# Patient Record
Sex: Female | Born: 1970 | Race: White | Hispanic: No | Marital: Married | Smoking: Never smoker
Health system: Southern US, Community
[De-identification: ages and names within clinical notes are randomized; demographics above are authoritative.]

## PROBLEM LIST (undated history)

## (undated) DIAGNOSIS — R3915 Urgency of urination: Secondary | ICD-10-CM

## (undated) DIAGNOSIS — Z87442 Personal history of urinary calculi: Secondary | ICD-10-CM

## (undated) DIAGNOSIS — N200 Calculus of kidney: Secondary | ICD-10-CM

## (undated) DIAGNOSIS — F909 Attention-deficit hyperactivity disorder, unspecified type: Secondary | ICD-10-CM

## (undated) DIAGNOSIS — N201 Calculus of ureter: Secondary | ICD-10-CM

## (undated) HISTORY — PX: APPENDECTOMY: SHX54

---
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
## 2000-02-04 ENCOUNTER — Emergency Department (HOSPITAL_COMMUNITY): Admission: EM | Admit: 2000-02-04 | Discharge: 2000-02-04 | Payer: Self-pay | Admitting: Emergency Medicine

## 2011-03-07 ENCOUNTER — Other Ambulatory Visit: Payer: Self-pay | Admitting: Obstetrics and Gynecology

## 2011-03-07 ENCOUNTER — Other Ambulatory Visit (HOSPITAL_COMMUNITY)
Admission: RE | Admit: 2011-03-07 | Discharge: 2011-03-07 | Disposition: A | Discharge status: Home / Self Care | Payer: Private Health Insurance - Indemnity | Source: Ambulatory Visit | Attending: Obstetrics and Gynecology | Admitting: Obstetrics and Gynecology

## 2011-03-07 ENCOUNTER — Ambulatory Visit (HOSPITAL_BASED_OUTPATIENT_CLINIC_OR_DEPARTMENT_OTHER)
Admission: RE | Admit: 2011-03-07 | Discharge: 2011-03-07 | Disposition: A | Discharge status: Home / Self Care | Payer: Private Health Insurance - Indemnity | Source: Ambulatory Visit | Attending: Obstetrics and Gynecology | Admitting: Obstetrics and Gynecology

## 2011-03-07 DIAGNOSIS — Z1231 Encounter for screening mammogram for malignant neoplasm of breast: Secondary | ICD-10-CM

## 2011-03-07 DIAGNOSIS — Z124 Encounter for screening for malignant neoplasm of cervix: Secondary | ICD-10-CM | POA: Insufficient documentation

## 2012-08-06 ENCOUNTER — Other Ambulatory Visit: Payer: Self-pay | Admitting: Obstetrics and Gynecology

## 2012-08-06 ENCOUNTER — Ambulatory Visit (HOSPITAL_BASED_OUTPATIENT_CLINIC_OR_DEPARTMENT_OTHER)
Admission: RE | Admit: 2012-08-06 | Discharge: 2012-08-06 | Disposition: A | Discharge status: Home / Self Care | Payer: Private Health Insurance - Indemnity | Source: Ambulatory Visit | Attending: Obstetrics and Gynecology | Admitting: Obstetrics and Gynecology

## 2012-08-06 ENCOUNTER — Other Ambulatory Visit (HOSPITAL_COMMUNITY)
Admission: RE | Admit: 2012-08-06 | Discharge: 2012-08-06 | Disposition: A | Discharge status: Home / Self Care | Payer: Private Health Insurance - Indemnity | Source: Ambulatory Visit | Attending: Obstetrics and Gynecology | Admitting: Obstetrics and Gynecology

## 2012-08-06 DIAGNOSIS — Z124 Encounter for screening for malignant neoplasm of cervix: Secondary | ICD-10-CM | POA: Insufficient documentation

## 2012-08-06 DIAGNOSIS — Z1231 Encounter for screening mammogram for malignant neoplasm of breast: Secondary | ICD-10-CM

## 2012-08-06 IMAGING — MG MM DIGITAL SCREENING BILAT
7 series · 7 of 7 positions shown · non-contrast
Comparison: Previous exams.

CLINICAL DATA: Screening.

DIGITAL BILATERAL SCREENING MAMMOGRAM WITH CAD

[R CC]
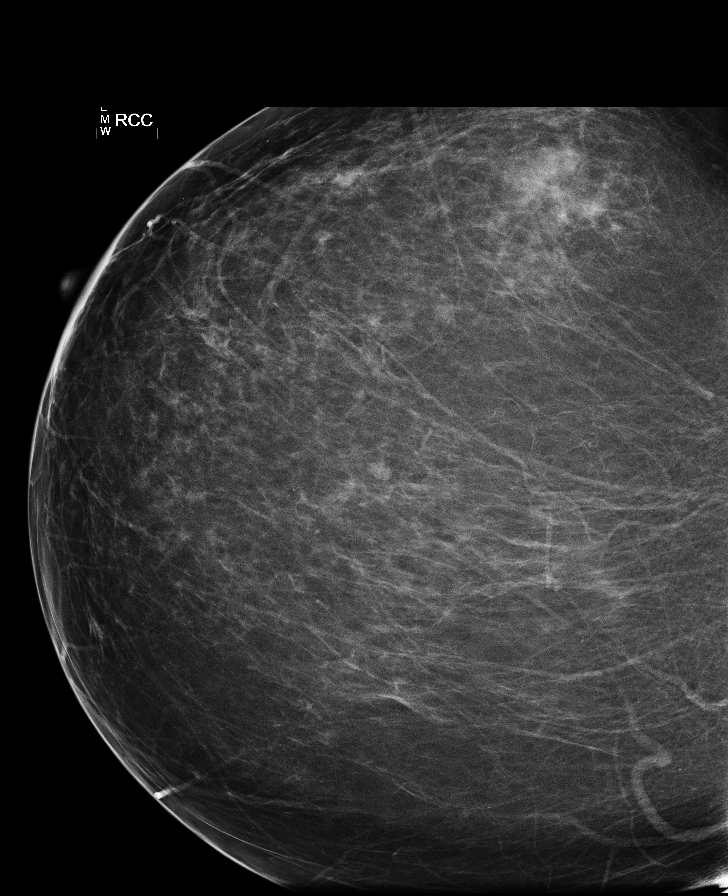

[L CC]
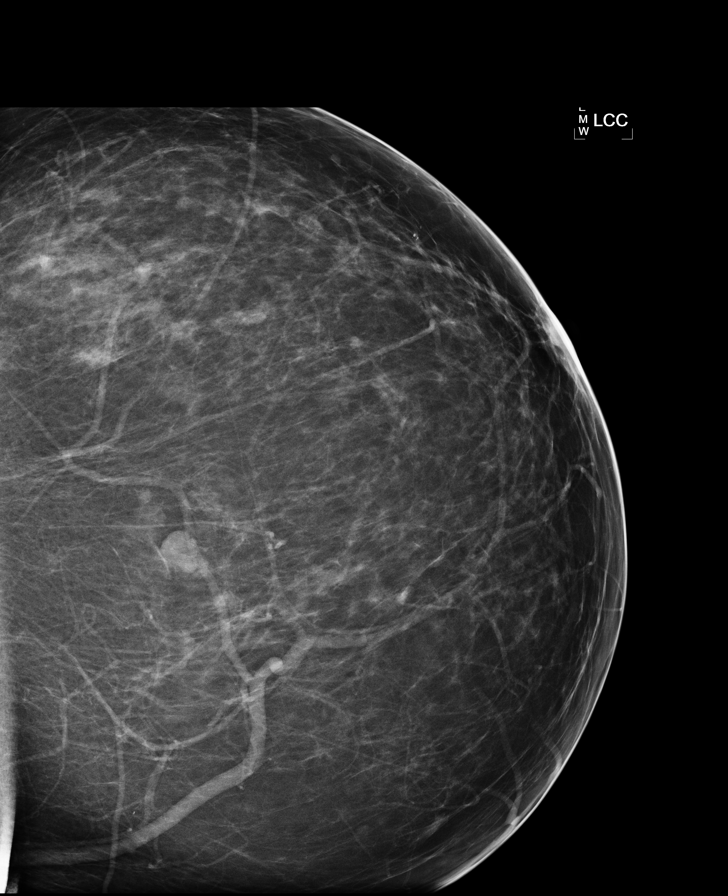

[L MLO (1 of 2)]
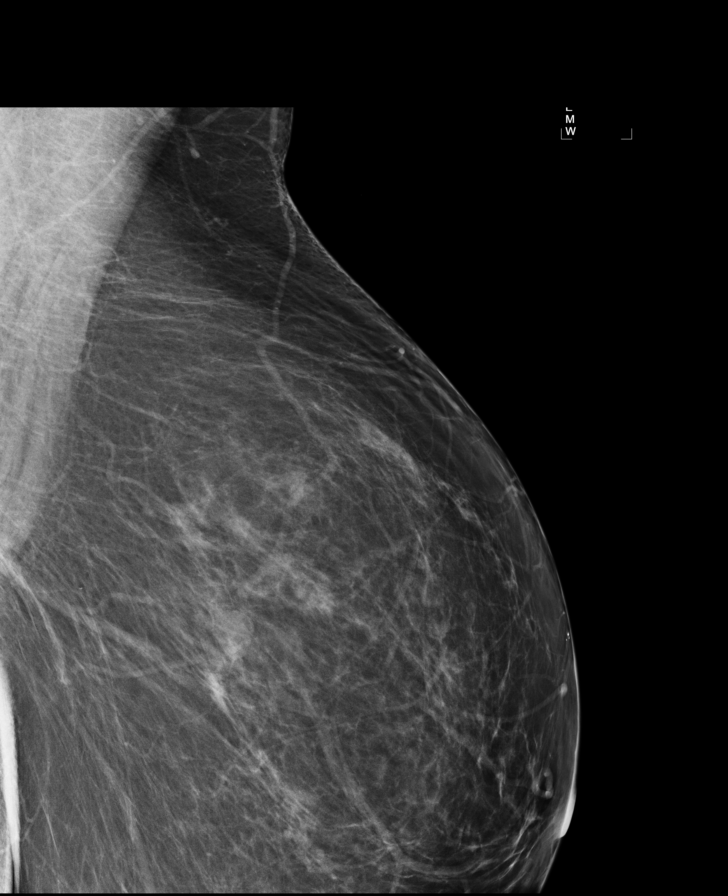

[R MLO (1 of 2)]
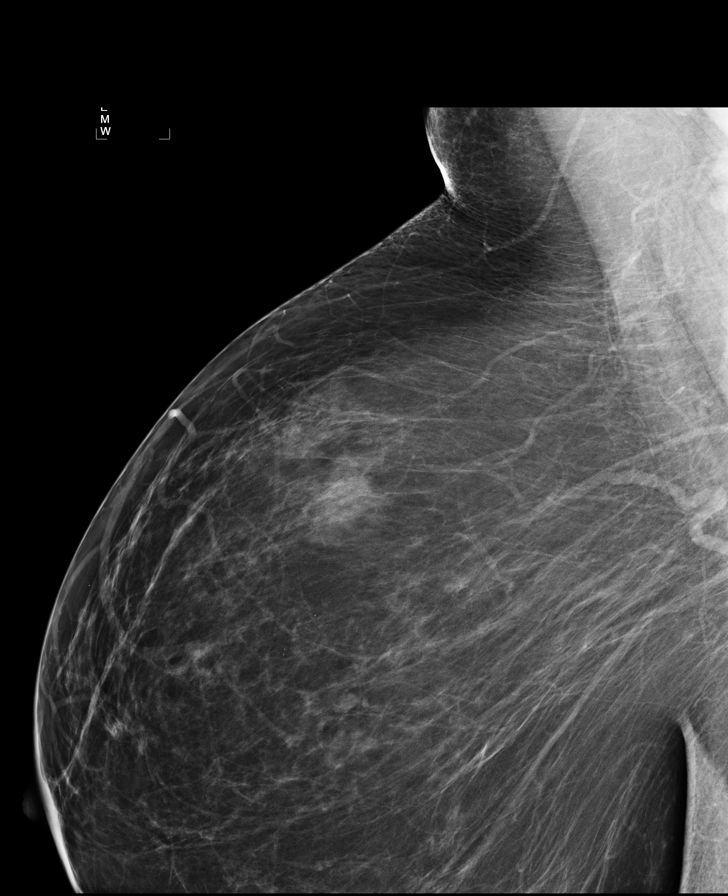

[L CV]
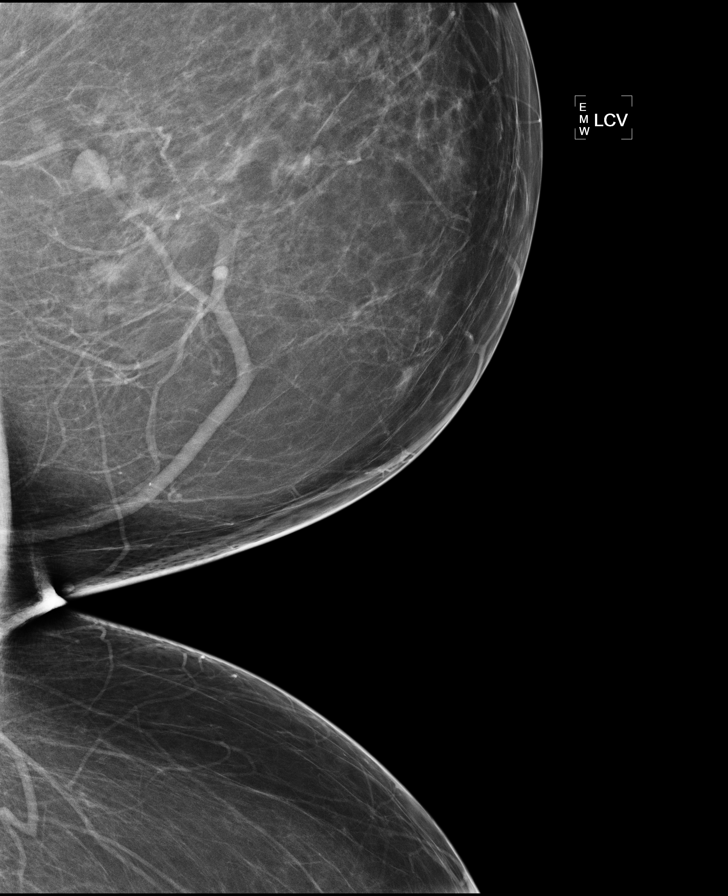

[L MLO (2 of 2)]
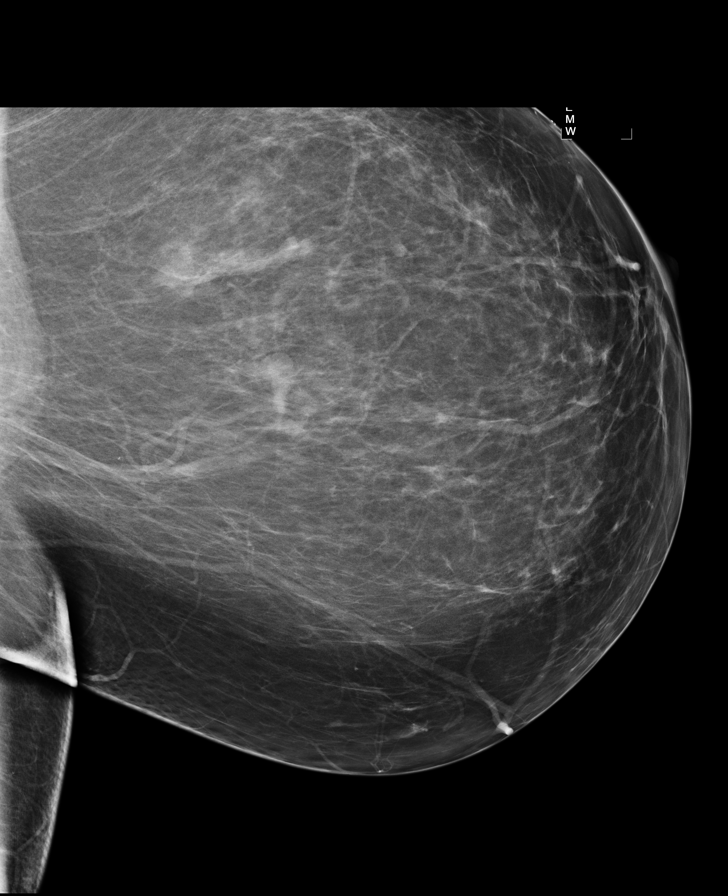

[R MLO (2 of 2)]
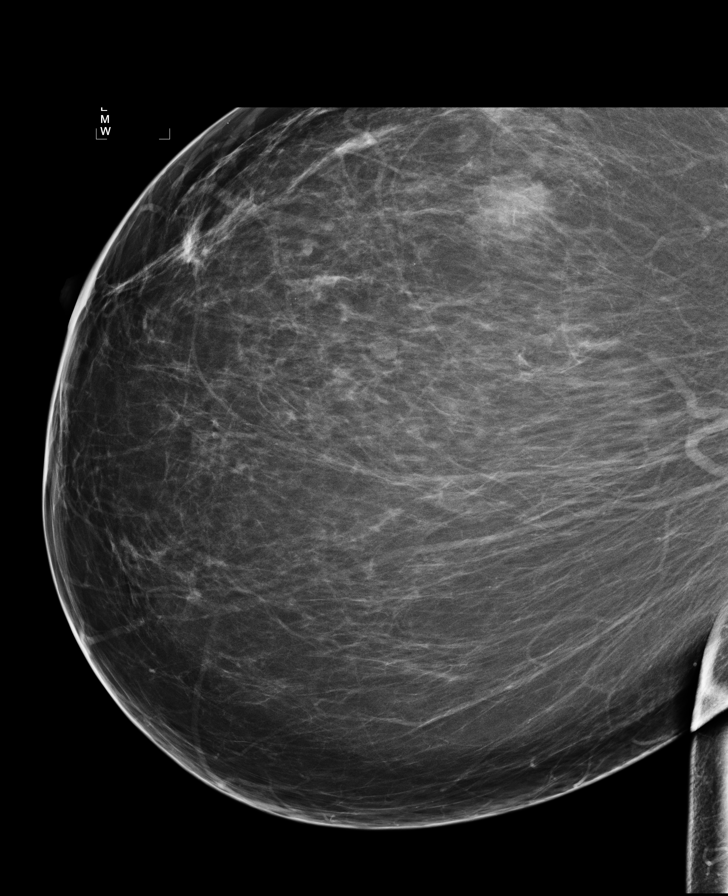

[7 of 7 positions shown; findings below may reference images not displayed]

FINDINGS: Two views of each breast demonstrate scattered
fibroglandular tissue.  In the left breast, a possible mass
warrants further evaluation with spot compression views and
possibly ultrasound.  In the right breast, no masses or malignant
type calcifications are identified.

Images were processed with CAD.
IMPRESSION: Further evaluation is suggested for possible mass in the left
breast.

RECOMMENDATION:
Diagnostic mammogram and possibly ultrasound of the left breast.
(Code:[id number])

BI-RADS CATEGORY 0:  Incomplete.  Need additional imaging
evaluation and/or prior mammograms for comparison.

## 2012-08-13 ENCOUNTER — Other Ambulatory Visit: Payer: Self-pay | Admitting: Obstetrics and Gynecology

## 2012-10-16 ENCOUNTER — Other Ambulatory Visit: Payer: Self-pay | Admitting: Obstetrics and Gynecology

## 2012-10-16 DIAGNOSIS — R928 Other abnormal and inconclusive findings on diagnostic imaging of breast: Secondary | ICD-10-CM

## 2012-11-03 ENCOUNTER — Ambulatory Visit
Admission: RE | Admit: 2012-11-03 | Discharge: 2012-11-03 | Disposition: A | Discharge status: Home / Self Care | Payer: Managed Care, Other (non HMO) | Source: Ambulatory Visit | Attending: Obstetrics and Gynecology | Admitting: Obstetrics and Gynecology

## 2012-11-03 DIAGNOSIS — R928 Other abnormal and inconclusive findings on diagnostic imaging of breast: Secondary | ICD-10-CM

## 2012-11-03 IMAGING — MG MM DIGITAL DIAGNOSTIC LIMITED*L*
5 series · 5 of 5 positions shown · non-contrast
Comparison: [DATE] [DATE], [DATE], [DATE] [DATE], [DATE]

CLINICAL DATA: Called back from screening mammogram for possible
mass left breast

DIGITAL DIAGNOSTIC LEFT MAMMOGRAM November 03, 2012 AND LEFT BREAST
ULTRASOUND:

[L CC (1 of 3)]
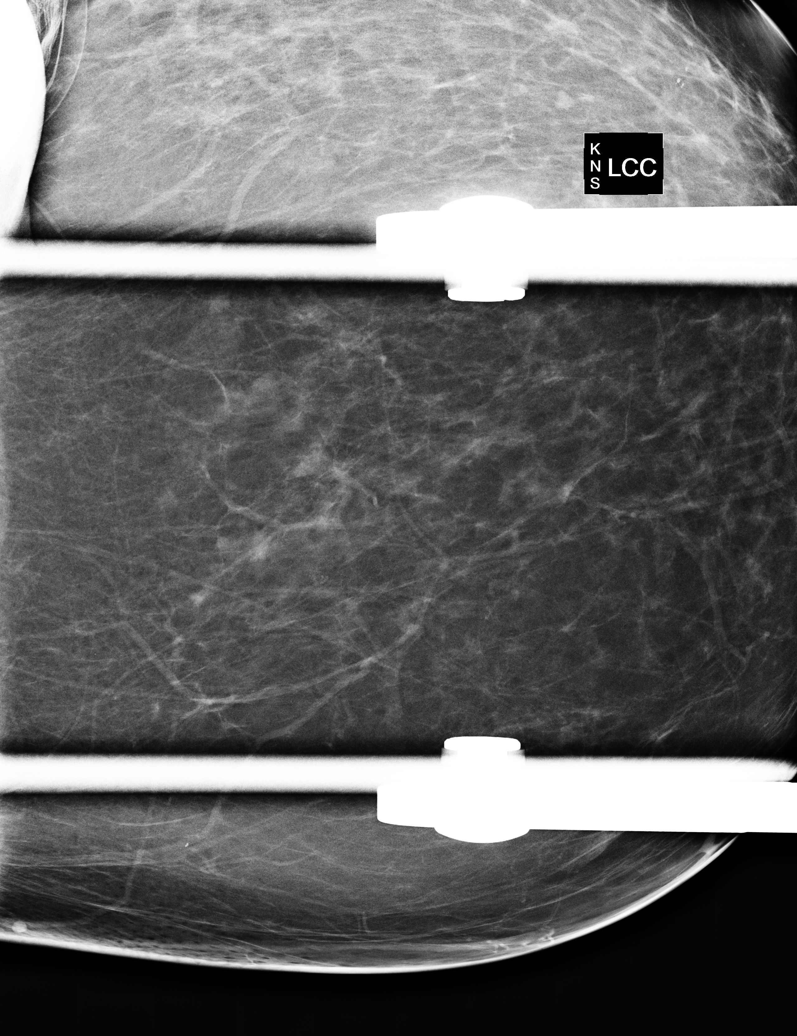

[L CC (2 of 3)]
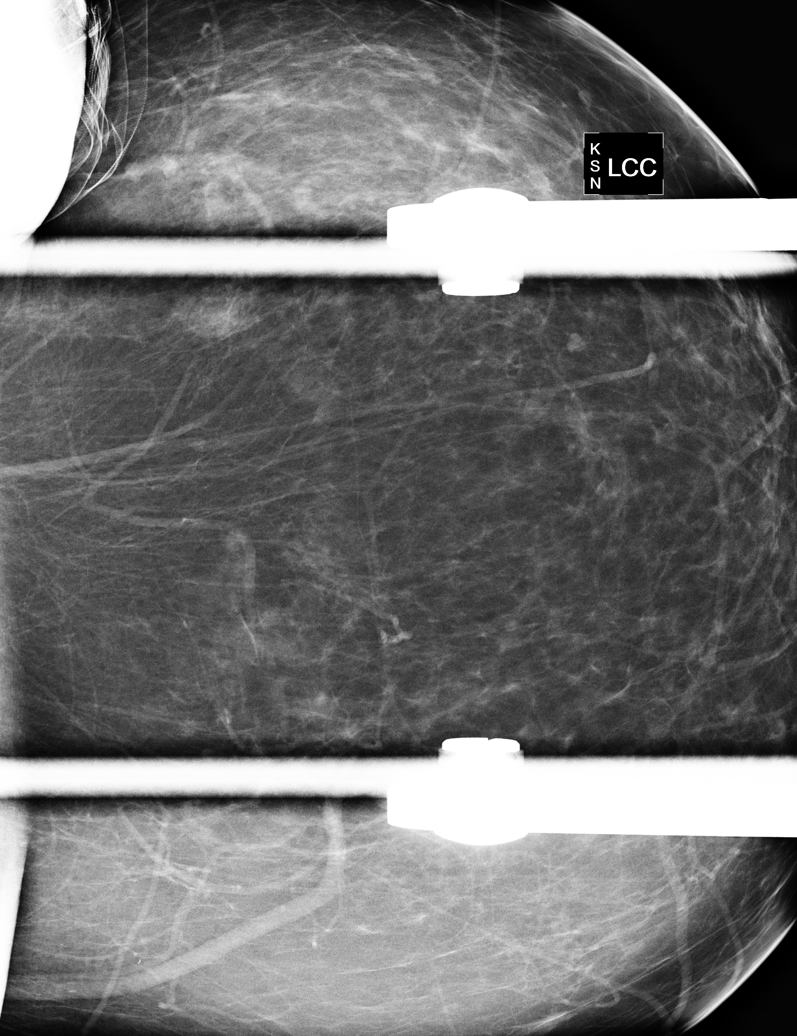

[L MLO (1 of 2)]
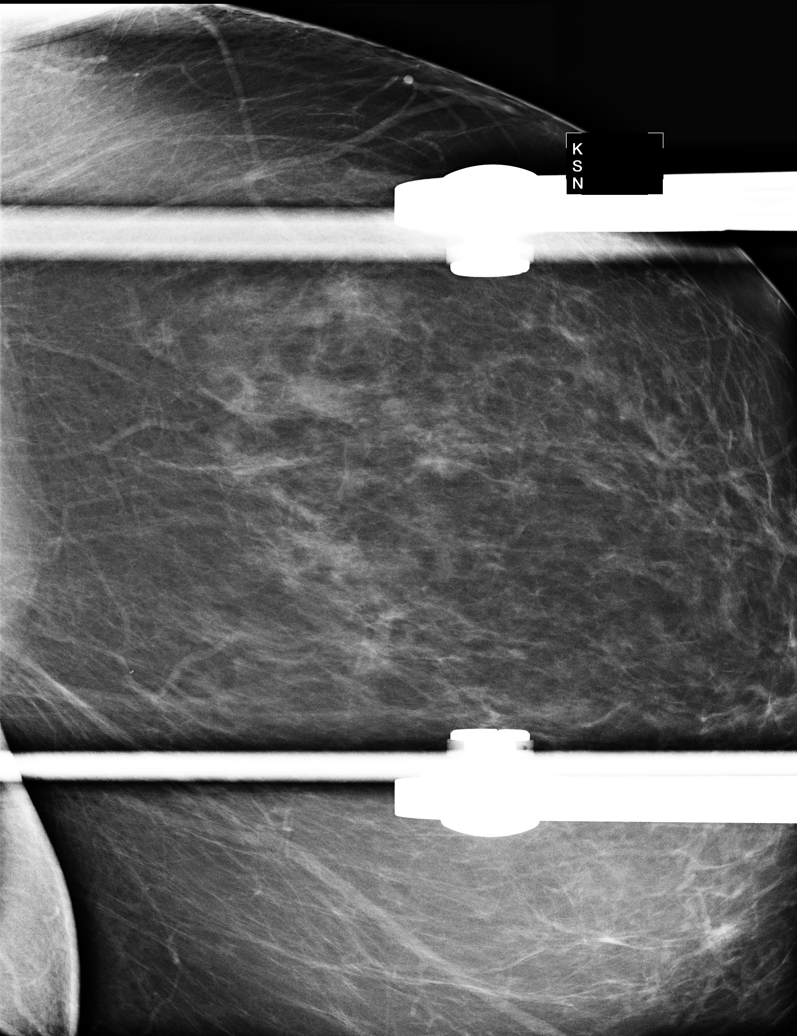

[L MLO (2 of 2)]
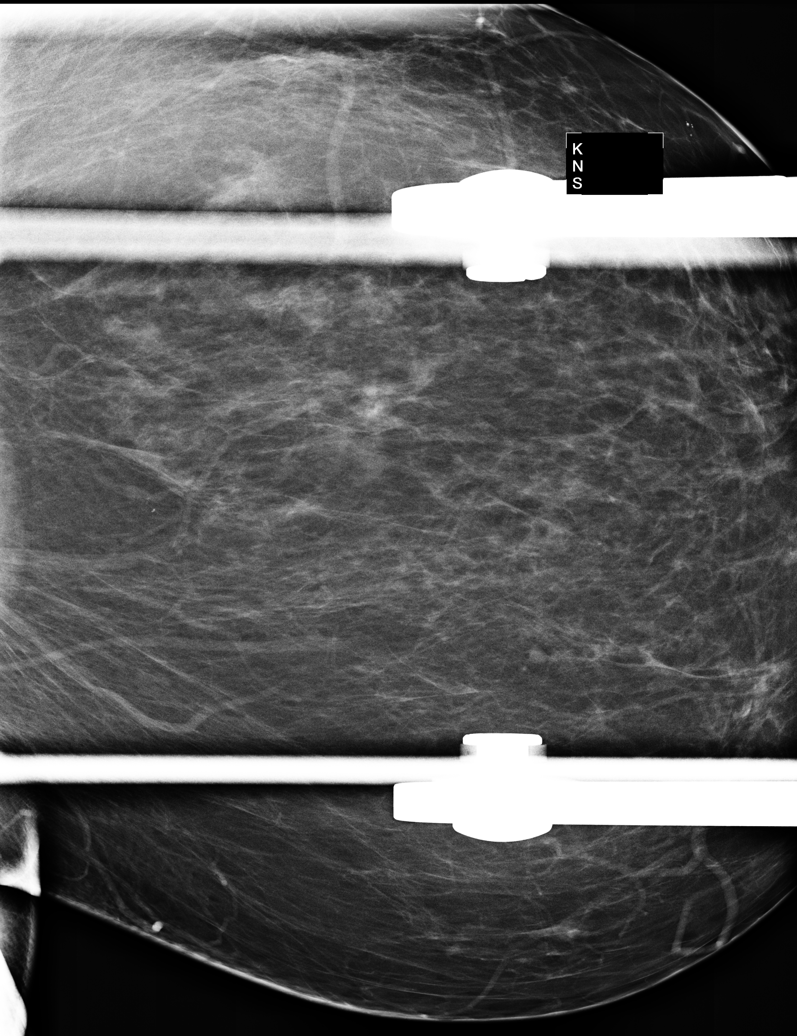

[L CC (3 of 3)]
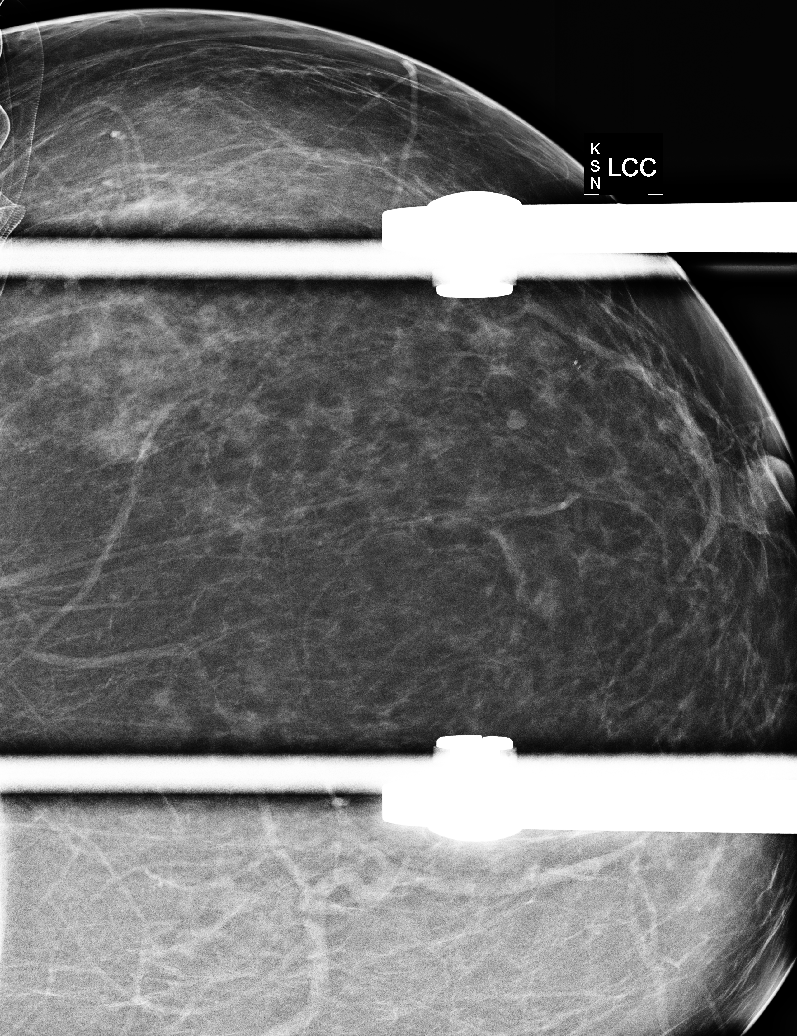

[5 of 5 positions shown; findings below may reference images not displayed]

FINDINGS: ACR Breast Density Category scattered fibroglandular tissue

Spot compression CC and MLO views of the left breast are submitted.
Previously questioned asymmetry in the left breast is not well seen
on spot compression views.

Ultrasound is performed, showing 5 mm simple cyst at the left
breast three o'clock position 4 cm from the nipple correlating to
the mammographic finding.
IMPRESSION: Benign findings.

RECOMMENDATION:
Routine screening mammogram back on schedule.

I have discussed the findings and recommendations with the patient.
Results were also provided in writing at the conclusion of the
visit.

BI-RADS CATEGORY 2:  Benign finding(s).

## 2012-11-03 IMAGING — US US BREAST*L*
1 series · 7 of 7 positions shown · non-contrast
Comparison: [DATE] [DATE], [DATE], [DATE] [DATE], [DATE]

CLINICAL DATA: Called back from screening mammogram for possible
mass left breast

DIGITAL DIAGNOSTIC LEFT MAMMOGRAM November 03, 2012 AND LEFT BREAST
ULTRASOUND:

[Series 1: us breast*left* · 7 of 7 slices shown]
[im 1/7]
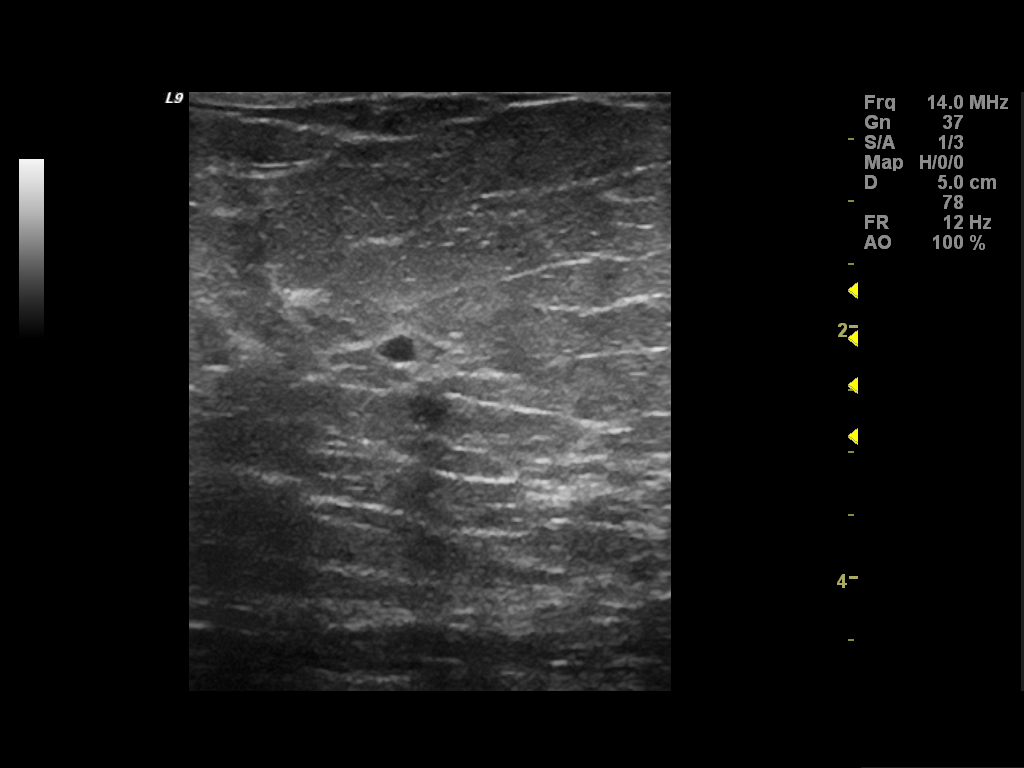
[im 2/7]
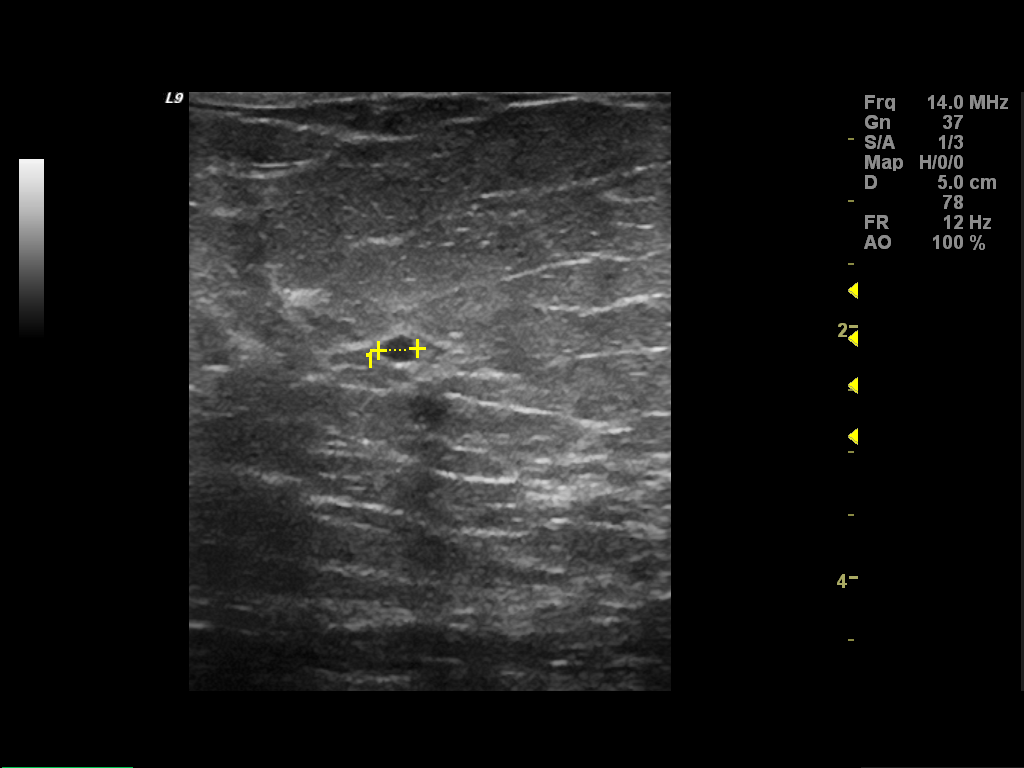
[im 3/7]
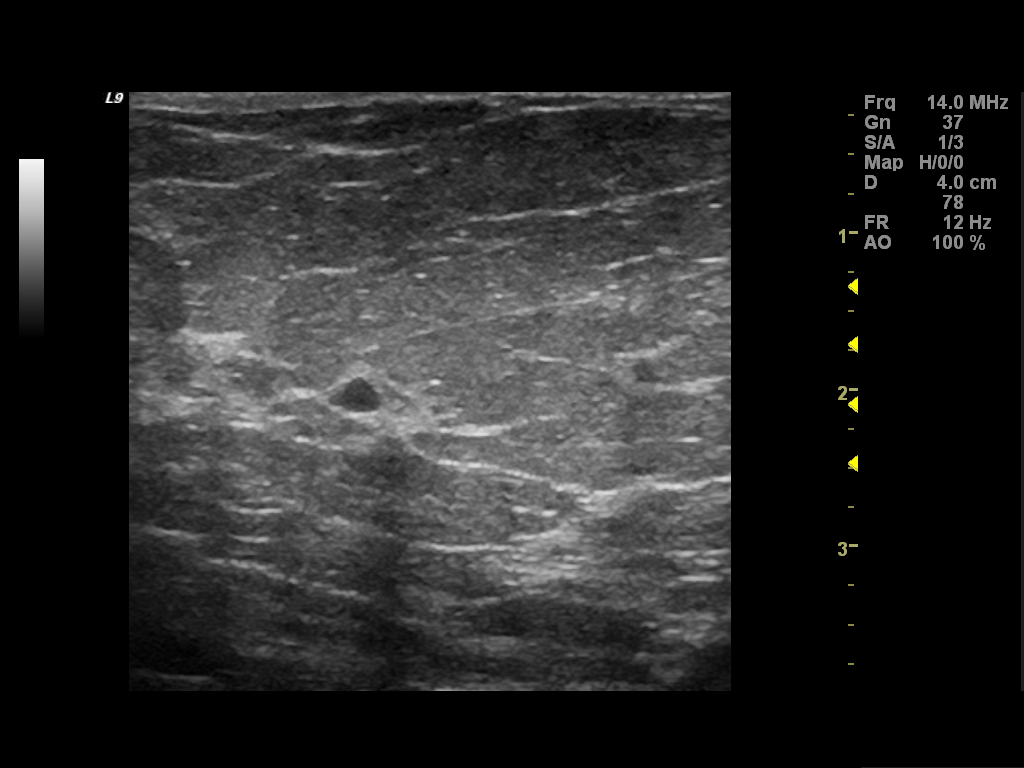
[im 4/7]
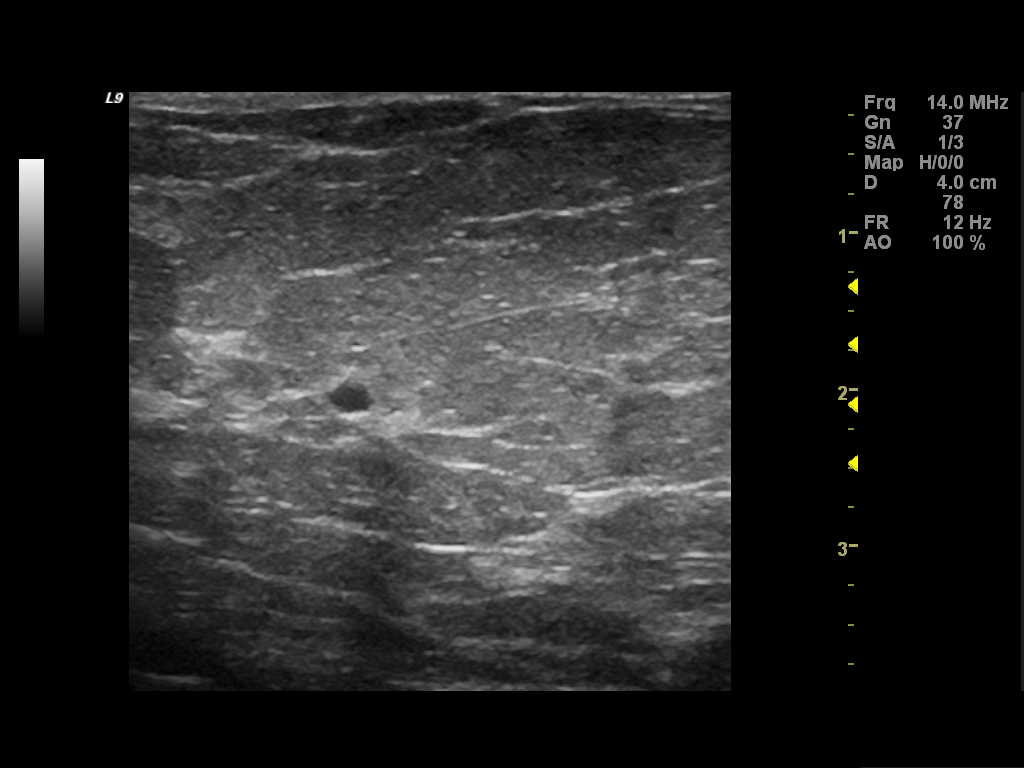
[im 5/7]
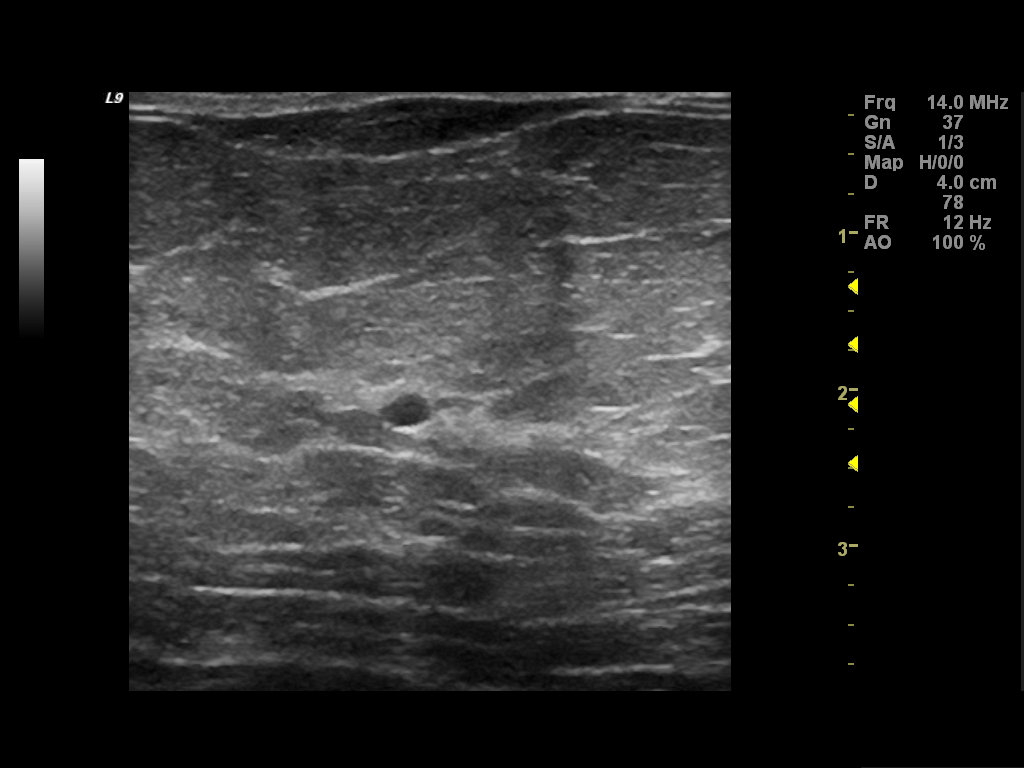
[im 6/7]
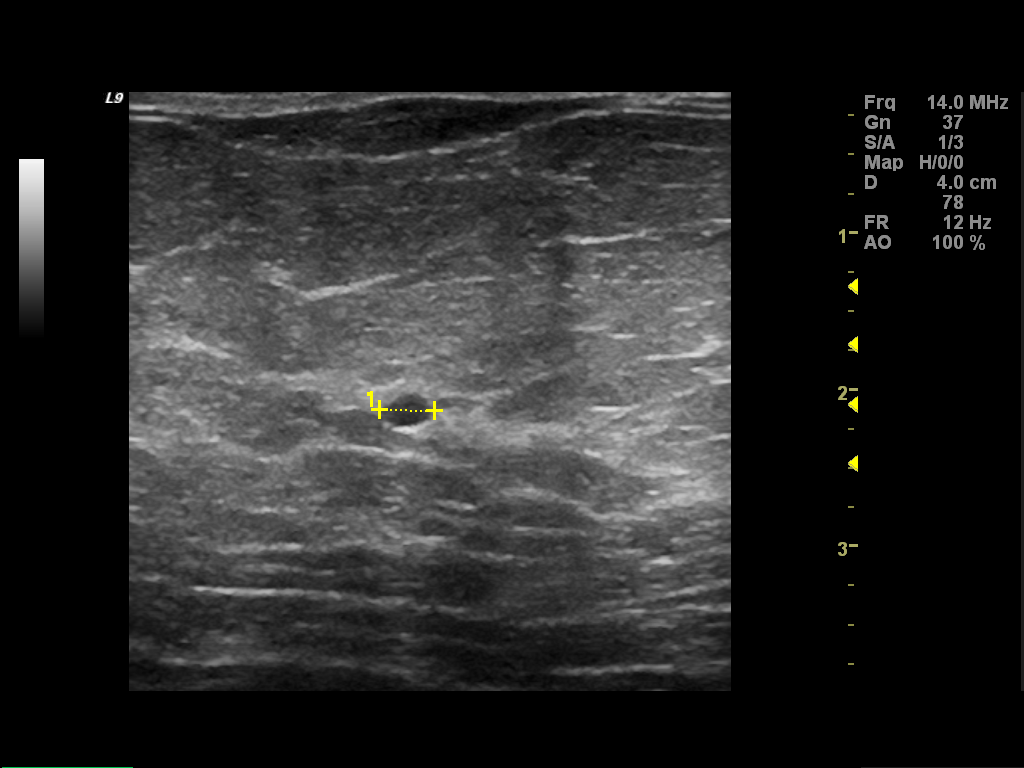
[im 7/7]
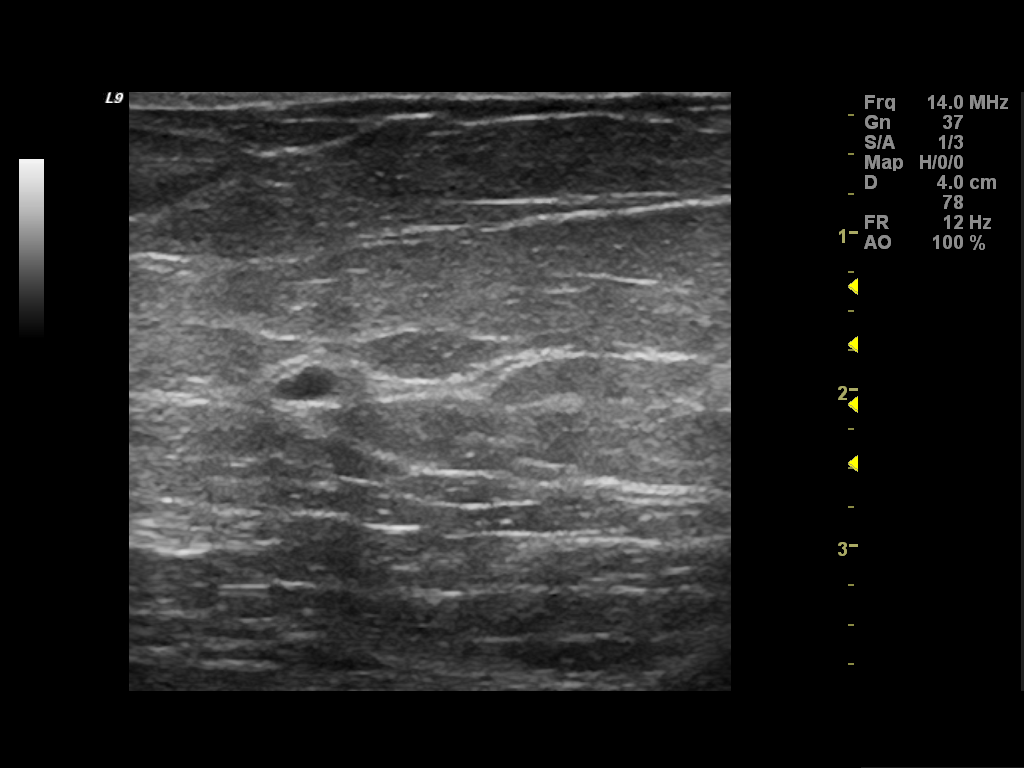

[7 of 7 positions shown; findings below may reference images not displayed]

FINDINGS: ACR Breast Density Category scattered fibroglandular tissue

Spot compression CC and MLO views of the left breast are submitted.
Previously questioned asymmetry in the left breast is not well seen
on spot compression views.

Ultrasound is performed, showing 5 mm simple cyst at the left
breast three o'clock position 4 cm from the nipple correlating to
the mammographic finding.
IMPRESSION: Benign findings.

RECOMMENDATION:
Routine screening mammogram back on schedule.

I have discussed the findings and recommendations with the patient.
Results were also provided in writing at the conclusion of the
visit.

BI-RADS CATEGORY 2:  Benign finding(s).

## 2015-03-16 ENCOUNTER — Emergency Department (HOSPITAL_BASED_OUTPATIENT_CLINIC_OR_DEPARTMENT_OTHER): Payer: Managed Care, Other (non HMO)

## 2015-03-16 ENCOUNTER — Encounter (HOSPITAL_BASED_OUTPATIENT_CLINIC_OR_DEPARTMENT_OTHER): Payer: Self-pay | Admitting: Emergency Medicine

## 2015-03-16 ENCOUNTER — Emergency Department (HOSPITAL_BASED_OUTPATIENT_CLINIC_OR_DEPARTMENT_OTHER)
Admission: EM | Admit: 2015-03-16 | Discharge: 2015-03-16 | Disposition: A | Discharge status: Home / Self Care | Payer: Managed Care, Other (non HMO) | Attending: Emergency Medicine | Admitting: Emergency Medicine

## 2015-03-16 DIAGNOSIS — R109 Unspecified abdominal pain: Secondary | ICD-10-CM

## 2015-03-16 DIAGNOSIS — E663 Overweight: Secondary | ICD-10-CM | POA: Insufficient documentation

## 2015-03-16 DIAGNOSIS — N200 Calculus of kidney: Secondary | ICD-10-CM | POA: Diagnosis not present

## 2015-03-16 DIAGNOSIS — Z9049 Acquired absence of other specified parts of digestive tract: Secondary | ICD-10-CM | POA: Diagnosis not present

## 2015-03-16 DIAGNOSIS — Z3202 Encounter for pregnancy test, result negative: Secondary | ICD-10-CM | POA: Diagnosis not present

## 2015-03-16 LAB — CBC WITH DIFFERENTIAL/PLATELET
BASOS ABS: 0 10*3/uL (ref 0.0–0.1)
Basophils Relative: 0 % (ref 0–1)
EOS ABS: 0.2 10*3/uL (ref 0.0–0.7)
EOS PCT: 3 % (ref 0–5)
HCT: 41.2 % (ref 36.0–46.0)
HEMOGLOBIN: 14 g/dL (ref 12.0–15.0)
Lymphocytes Relative: 38 % (ref 12–46)
Lymphs Abs: 2.9 10*3/uL (ref 0.7–4.0)
MCH: 30.6 pg (ref 26.0–34.0)
MCHC: 34 g/dL (ref 30.0–36.0)
MCV: 90 fL (ref 78.0–100.0)
MONO ABS: 1 10*3/uL (ref 0.1–1.0)
MONOS PCT: 13 % — AB (ref 3–12)
NEUTROS ABS: 3.6 10*3/uL (ref 1.7–7.7)
NEUTROS PCT: 46 % (ref 43–77)
Platelets: 252 10*3/uL (ref 150–400)
RBC: 4.58 MIL/uL (ref 3.87–5.11)
RDW: 13 % (ref 11.5–15.5)
WBC: 7.6 10*3/uL (ref 4.0–10.5)

## 2015-03-16 LAB — URINALYSIS, ROUTINE W REFLEX MICROSCOPIC
Bilirubin Urine: NEGATIVE
Glucose, UA: NEGATIVE mg/dL
Ketones, ur: NEGATIVE mg/dL
LEUKOCYTES UA: NEGATIVE
Nitrite: NEGATIVE
Protein, ur: NEGATIVE mg/dL
Specific Gravity, Urine: 1.008 (ref 1.005–1.030)
UROBILINOGEN UA: 0.2 mg/dL (ref 0.0–1.0)
pH: 7.5 (ref 5.0–8.0)

## 2015-03-16 LAB — URINE MICROSCOPIC-ADD ON

## 2015-03-16 LAB — BASIC METABOLIC PANEL
ANION GAP: 9 (ref 5–15)
BUN: 20 mg/dL (ref 6–20)
CALCIUM: 8.5 mg/dL — AB (ref 8.9–10.3)
CHLORIDE: 105 mmol/L (ref 101–111)
CO2: 23 mmol/L (ref 22–32)
CREATININE: 0.73 mg/dL (ref 0.44–1.00)
GFR calc non Af Amer: >60 mL/min (ref >60)
Glucose, Bld: 133 mg/dL — ABNORMAL HIGH (ref 65–99)
Potassium: 3.9 mmol/L (ref 3.5–5.1)
SODIUM: 137 mmol/L (ref 135–145)

## 2015-03-16 LAB — PREGNANCY, URINE: PREG TEST UR: NEGATIVE

## 2015-03-16 LAB — HCG, QUANTITATIVE, PREGNANCY

## 2015-03-16 IMAGING — CT CT RENAL STONE PROTOCOL
1 of 2 series · 15 of 32 positions shown, 19 images · non-contrast
Comparison: None.

CLINICAL DATA: Left flank pain.

EXAM:
CT ABDOMEN AND PELVIS WITHOUT CONTRAST
TECHNIQUE: Multidetector CT imaging of the abdomen and pelvis was performed
following the standard protocol without IV contrast.

[Series 2: renal stone < 200 lbs 5.0 b31f · axial · 0.95mm/px · z∈[+648,+1068]mm · 15 of 92 slices shown, 19 images]
[im 4/92  soft-tissue]
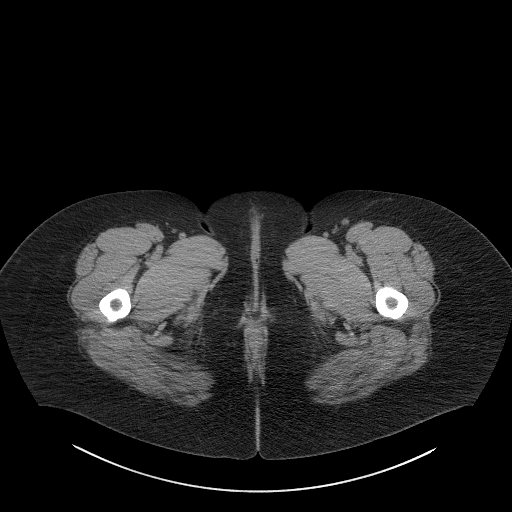
[im 4/92  bone]
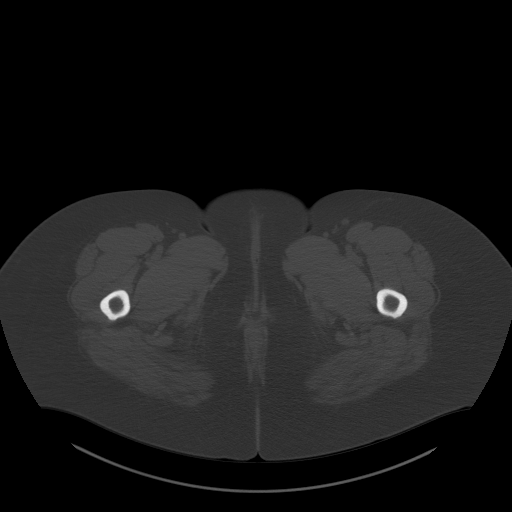
[im 12/92  soft-tissue]
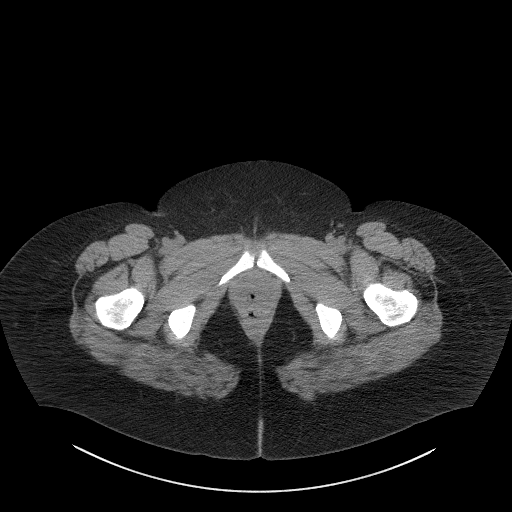
[im 20/92  soft-tissue]
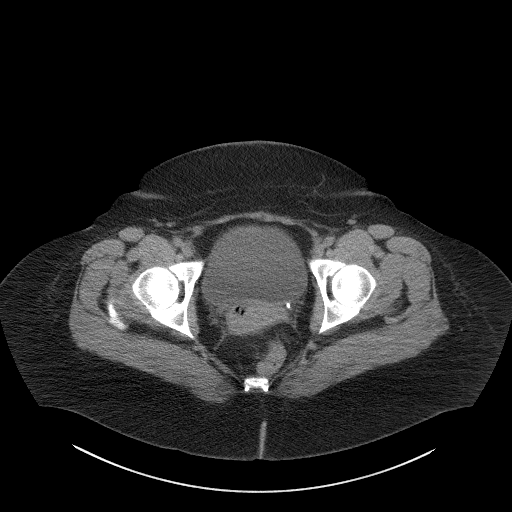
[im 24/92  soft-tissue]
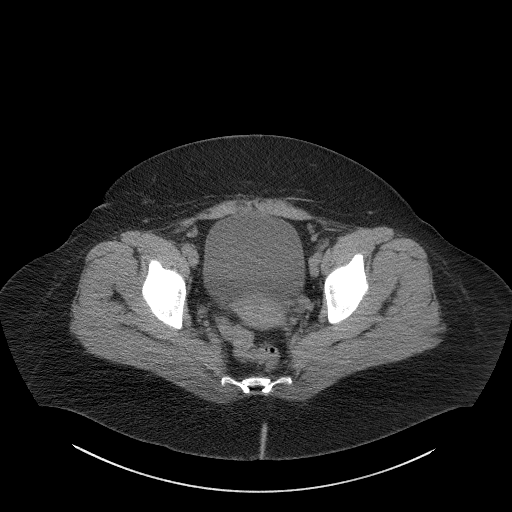
[im 32/92  soft-tissue]
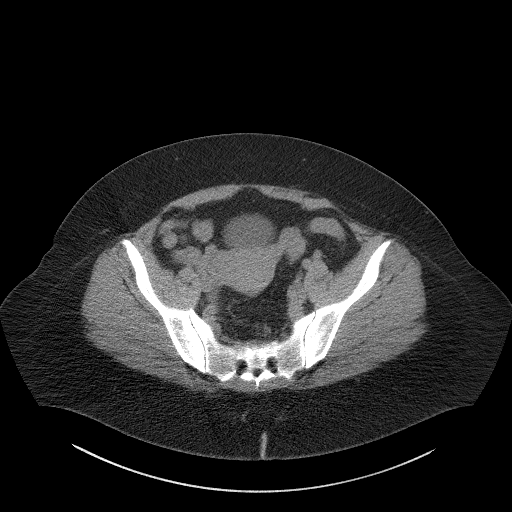
[im 40/92  soft-tissue]
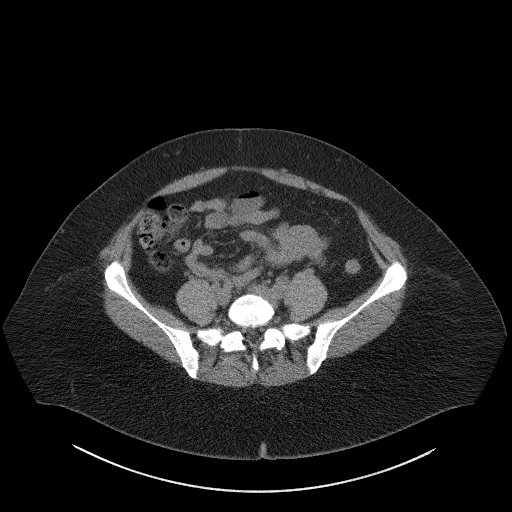
[im 48/92  soft-tissue]
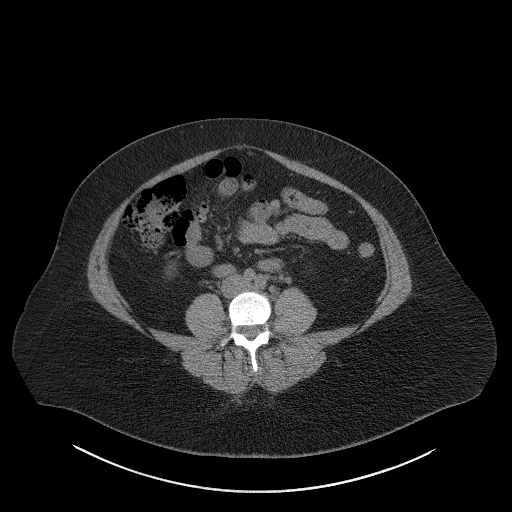
[im 52/92  soft-tissue]
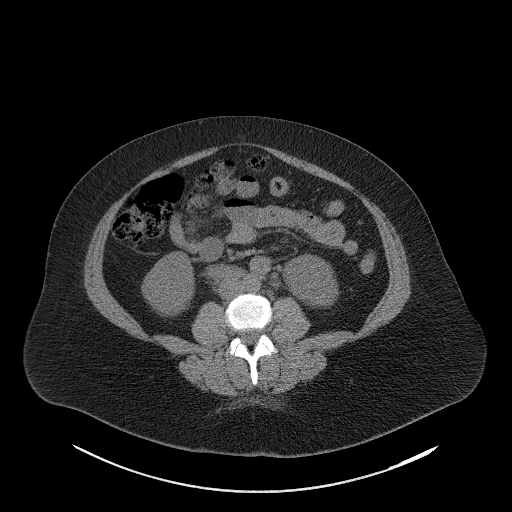
[im 60/92  soft-tissue]
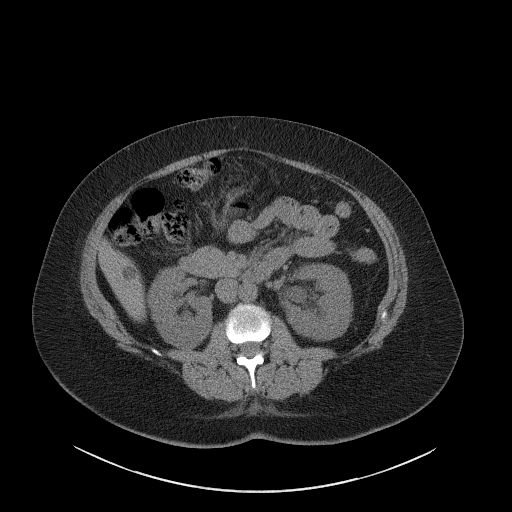
[im 60/92  bone]
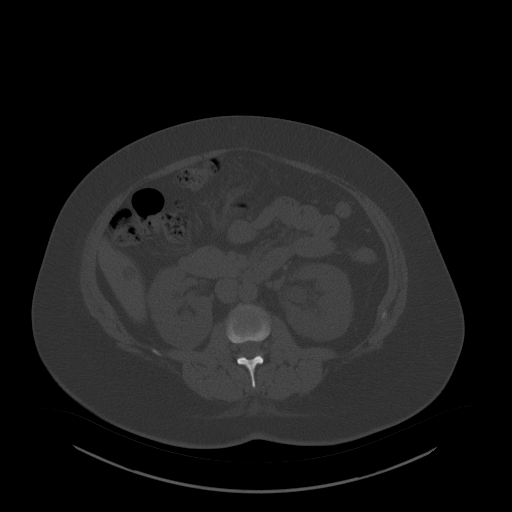
[im 68/92  soft-tissue]
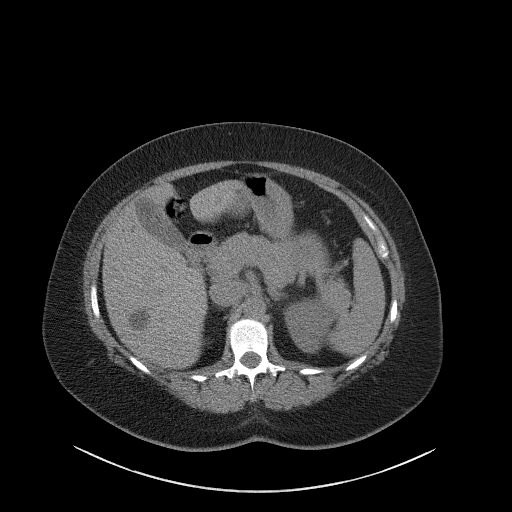
[im 72/92  soft-tissue]
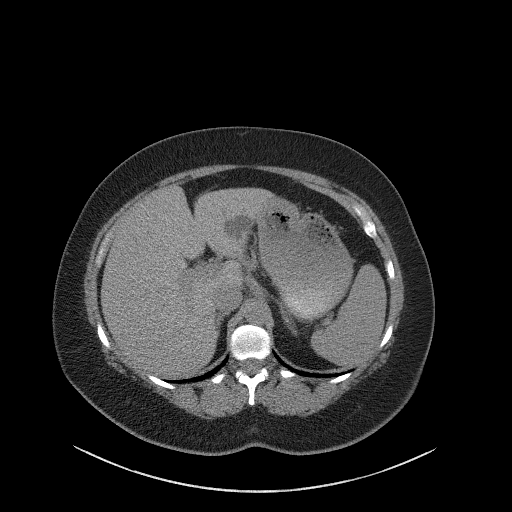
[im 76/92  lung]
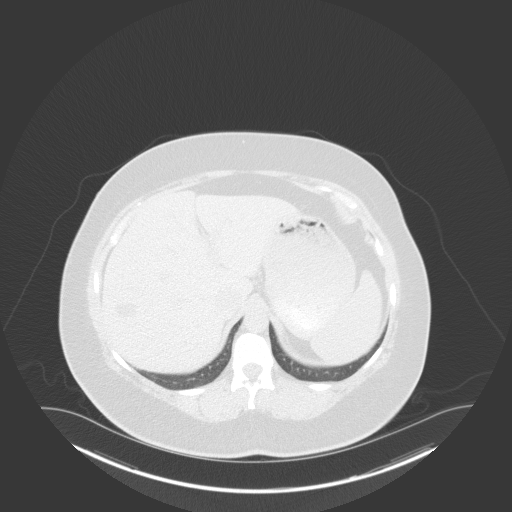
[im 80/92  soft-tissue]
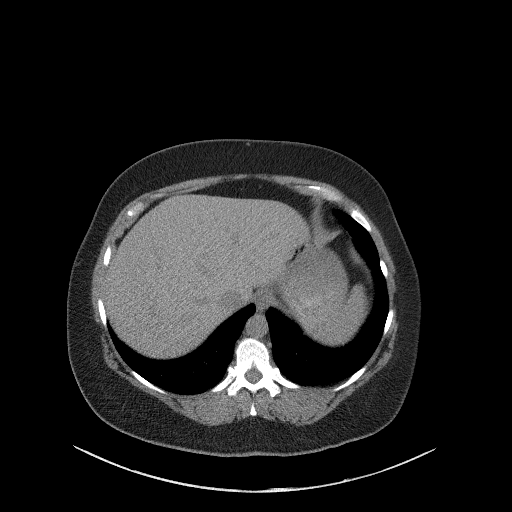
[im 80/92  lung]
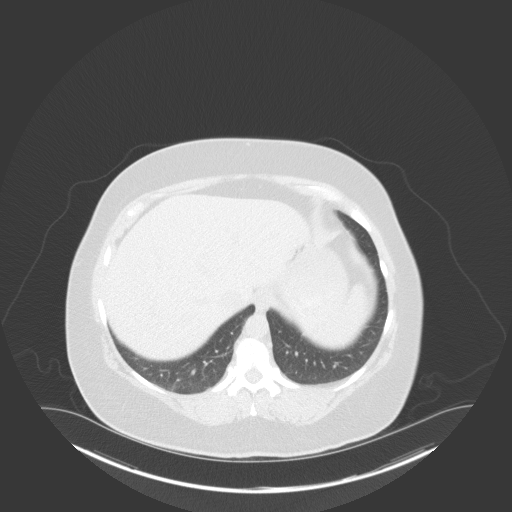
[im 84/92  lung]
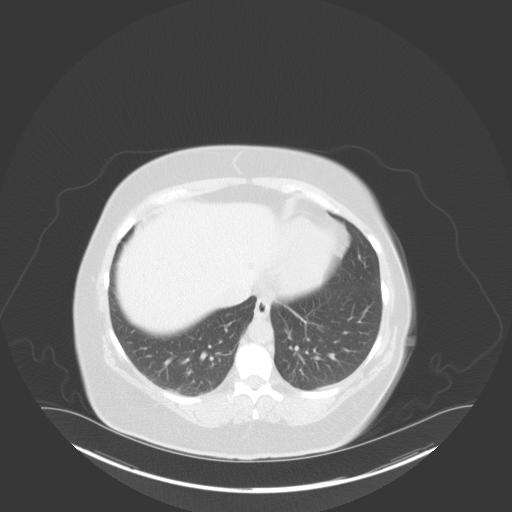
[im 88/92  soft-tissue]
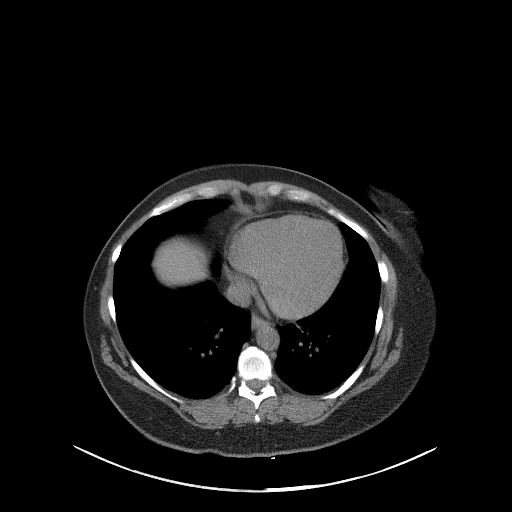
[im 88/92  lung]
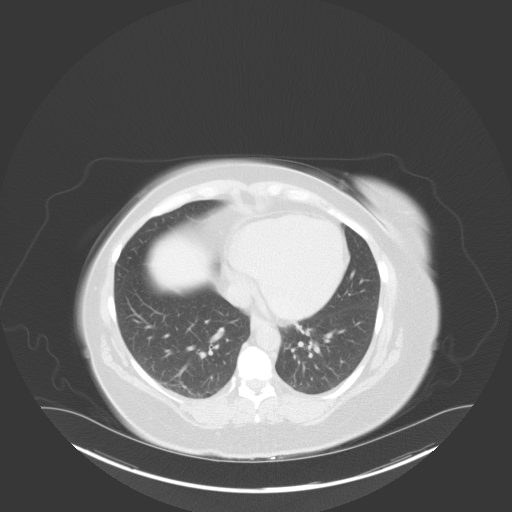

[15 of 32 positions shown; findings below may reference images not displayed]

FINDINGS: The included lung bases are clear.

There is a 5 mm obstructing stone at the left ureterovesicular
junction with mild resultant hydroureteronephrosis and minimal
perinephric stranding. There are multiple, at least 4 nonobstructing
stones in the upper and lower left kidney. Punctate nonobstructing
stone in the lower right kidney, no right obstructive uropathy.

Evaluation of the remaining solid and hollow viscera is limited
given lack of contrast. Scattered hypodense lesions throughout the
right and left lobe of the liver, largest within the left lobe
measures 2.8 cm. Larger lesions noted simple fluid density, small
lesions are too small to characterize. These likely represent cysts
or hemangiomas. The spleen, pancreas, and adrenal glands are normal.
The gallbladder is physiologically distended.

The stomach is physiologically distended. There are no dilated or
thickened bowel loops. The appendix is not seen. Small volume of
colonic stool without colonic wall thickening. No free air, free
fluid, or intra-abdominal fluid collection.

No retroperitoneal adenopathy. Abdominal aorta is normal in caliber.

Within the pelvis the bladder is physiologically distended without
wall thickening. Uterus and adnexa are normal. No pelvic free fluid.
No pelvic adenopathy.

There are no acute or suspicious osseous abnormalities.
IMPRESSION: 1. Obstructing 5 mm stone at the left ureterovesicular junction with
mild resultant hydroureteronephrosis and minimal perinephric
stranding.
2. Additional nonobstructing stones in both kidneys.
3. Multiple hypodense liver lesions, likely cysts or hemangiomas.

## 2015-03-16 MED ORDER — ONDANSETRON HCL 4 MG/2ML IJ SOLN
4.0000 mg | Freq: Once | INTRAMUSCULAR | Status: AC
  Administered 2015-03-16: 4 mg via INTRAVENOUS

## 2015-03-16 MED ORDER — MORPHINE SULFATE 4 MG/ML IJ SOLN
INTRAMUSCULAR | Status: AC
  Administered 2015-03-16: 4 mg via INTRAVENOUS
  Filled 2015-03-16: qty 1

## 2015-03-16 MED ORDER — OXYCODONE-ACETAMINOPHEN 5-325 MG PO TABS: 1.0000 | ORAL_TABLET | Freq: Four times a day (QID) | ORAL | Status: DC | PRN

## 2015-03-16 MED ORDER — KETOROLAC TROMETHAMINE 30 MG/ML IJ SOLN
30.0000 mg | Freq: Once | INTRAMUSCULAR | Status: AC
  Administered 2015-03-16: 30 mg via INTRAVENOUS
  Filled 2015-03-16: qty 1

## 2015-03-16 MED ORDER — HYDROMORPHONE HCL 1 MG/ML IJ SOLN
1.0000 mg | Freq: Once | INTRAMUSCULAR | Status: AC
  Administered 2015-03-16: 1 mg via INTRAVENOUS
  Filled 2015-03-16: qty 1

## 2015-03-16 MED ORDER — MORPHINE SULFATE 4 MG/ML IJ SOLN
4.0000 mg | Freq: Once | INTRAMUSCULAR | Status: AC
  Administered 2015-03-16: 4 mg via INTRAVENOUS

## 2015-03-16 MED ORDER — ONDANSETRON HCL 4 MG/2ML IJ SOLN
INTRAMUSCULAR | Status: AC
  Administered 2015-03-16: 4 mg via INTRAVENOUS
  Filled 2015-03-16: qty 2

## 2015-03-16 MED ORDER — OXYCODONE-ACETAMINOPHEN 5-325 MG PO TABS
1.0000 | ORAL_TABLET | Freq: Once | ORAL | Status: AC
  Administered 2015-03-16: 1 via ORAL
  Filled 2015-03-16: qty 1

## 2015-03-16 MED ORDER — SODIUM CHLORIDE 0.9 % IV BOLUS (SEPSIS)
1000.0000 mL | Freq: Once | INTRAVENOUS | Status: AC
  Administered 2015-03-16: 1000 mL via INTRAVENOUS

## 2015-03-16 MED ORDER — ONDANSETRON HCL 4 MG PO TABS: 4.0000 mg | ORAL_TABLET | Freq: Three times a day (TID) | ORAL | Status: DC | PRN

## 2015-03-16 MED ORDER — ONDANSETRON 4 MG PO TBDP
4.0000 mg | ORAL_TABLET | Freq: Once | ORAL | Status: AC
  Administered 2015-03-16: 4 mg via ORAL
  Filled 2015-03-16: qty 1

## 2015-03-16 NOTE — ED Provider Notes (Addendum)
CSN: [id number]     Arrival date & time 03/16/15  0431 History   First MD Initiated Contact with Patient 03/16/15 [phone]     Chief Complaint  Patient presents with  . Flank Pain     (Consider location/radiation/quality/duration/timing/severity/associated sxs/prior Treatment) HPI  This is a 44 year old female who presents with left flank pain. Patient reports onset of pain possibly one hour prior to arrival. She is difficult to examine and obtain history from secondary to pain. She is writhing on the bed. Rates pain at 10 out of 10. It is in her left flank. No history of kidney stones. Denies dysuria or hematuria.  History reviewed. No pertinent past medical history. Past Surgical History  Procedure Laterality Date  . Appendectomy     No family history on file. History  Substance Use Topics  . Smoking status: Never Smoker   . Smokeless tobacco: Not on file  . Alcohol Use: Not on file   OB History    No data available     Review of Systems  Unable to perform ROS: Acuity of condition      Allergies  Review of patient's allergies indicates no known allergies.  Home Medications   Prior to Admission medications   Medication Sig Start Date End Date Taking? Authorizing Provider  ondansetron (ZOFRAN) 4 MG tablet Take 1 tablet (4 mg total) by mouth every 8 (eight) hours as needed for nausea or vomiting. 03/16/15   Shon Baton, MD  oxyCODONE-acetaminophen (PERCOCET/ROXICET) 5-325 MG per tablet Take 1-2 tablets by mouth every 6 (six) hours as needed for severe pain. 03/16/15   Shon Baton, MD   BP 159/123 mmHg  Pulse 84  Temp(Src) 98.2 F (36.8 C) (Oral)  Resp 22  Ht  (1.575 m)  Wt 245 lb (111.131 kg)  BMI 44.80 kg/m2  SpO2 99% Physical Exam  Constitutional: She is oriented to person, place, and time. She appears well-developed and well-nourished.  Overweight, uncomfortable appearing  HENT:  Head: Normocephalic and atraumatic.  Cardiovascular: Normal rate and  regular rhythm.   No murmur heard. Pulmonary/Chest: Effort normal. No respiratory distress.  Abdominal: Soft. Bowel sounds are normal. There is no tenderness.  Genitourinary:  No CVA tenderness  Neurological: She is alert and oriented to person, place, and time.  Skin: Skin is warm and dry.  Psychiatric: She has a normal mood and affect.  Nursing note and vitals reviewed.   ED Course  Procedures (including critical care time) Labs Review Labs Reviewed  CBC WITH DIFFERENTIAL/PLATELET - Abnormal; Notable for the following:    Monocytes Relative 13 (*)    All other components within normal limits  BASIC METABOLIC PANEL - Abnormal; Notable for the following:    Glucose, Bld 133 (*)    Calcium 8.5 (*)    All other components within normal limits  URINALYSIS, ROUTINE W REFLEX MICROSCOPIC (NOT AT [facility]) - Abnormal; Notable for the following:    Hgb urine dipstick SMALL (*)    All other components within normal limits  PREGNANCY, URINE  HCG, QUANTITATIVE, PREGNANCY  URINE MICROSCOPIC-ADD ON    Imaging Review Ct Renal Stone Study  03/16/2015   CLINICAL DATA:  Left flank pain.  EXAM: CT ABDOMEN AND PELVIS WITHOUT CONTRAST  TECHNIQUE: Multidetector CT imaging of the abdomen and pelvis was performed following the standard protocol without IV contrast.  COMPARISON:  None.  FINDINGS: The included lung bases are clear.  There is a 5 mm obstructing stone at the  left ureterovesicular junction with mild resultant hydroureteronephrosis and minimal perinephric stranding. There are multiple, at least 4 nonobstructing stones in the upper and lower left kidney. Punctate nonobstructing stone in the lower right kidney, no right obstructive uropathy.  Evaluation of the remaining solid and hollow viscera is limited given lack of contrast. Scattered hypodense lesions throughout the right and left lobe of the liver, largest within the left lobe measures 2.8 cm. Larger lesions noted simple fluid density, small  lesions are too small to characterize. These likely represent cysts or hemangiomas. The spleen, pancreas, and adrenal glands are normal. The gallbladder is physiologically distended.  The stomach is physiologically distended. There are no dilated or thickened bowel loops. The appendix is not seen. Small volume of colonic stool without colonic wall thickening. No free air, free fluid, or intra-abdominal fluid collection.  No retroperitoneal adenopathy. Abdominal aorta is normal in caliber.  Within the pelvis the bladder is physiologically distended without wall thickening. Uterus and adnexa are normal. No pelvic free fluid. No pelvic adenopathy.  There are no acute or suspicious osseous abnormalities.  IMPRESSION: 1. Obstructing 5 mm stone at the left ureterovesicular junction with mild resultant hydroureteronephrosis and minimal perinephric stranding. 2. Additional nonobstructing stones in both kidneys. 3. Multiple hypodense liver lesions, likely cysts or hemangiomas.   Electronically Signed   By: Rubye OaksMelanie  Ehinger M.D.   On: 03/16/2015 05:47     EKG Interpretation None      MDM   Final diagnoses:  Left flank pain  Kidney stone   Patient presents with acute onset of left flank pain. Very uncomfortable. Difficult to obtain history from initially. No history of kidney stones but this is my suspicion.  Patient given morphine and 2 doses of Dilaudid for pain under control. Lab work is largely reassuring including kidney function. CT ordered.  6:06 AM On recheck, patient reports improvement of symptoms. CT scan shows 5 mm kidney stone. No History of kidney stones. UA pending.  6:40 AM UA without evidence of infection. Patient continues to be comfortable. Discussed with patient and her husband expected management including fluids, pain, and nausea medication. She will be given urology follow-up.  After history, exam, and medical workup I feel the patient has been appropriately medically screened and  is safe for discharge home. Pertinent diagnoses were discussed with the patient. Patient was given return precautions.      Shon Batonourtney F Sylvan Lahm, MD 03/16/15 [phone]  7:13 AM One episode of vomiting. Redosed dilaudid and Zofran. Dr. Bebe ShaggyWickline to reevaluate.  Anticipate discharge as above.  Shon Batonourtney F Lexander Tremblay, MD 03/16/15 2253

## 2015-03-16 NOTE — ED Notes (Signed)
Patient states that she woke up about an hour ago with left flank pain

## 2015-03-16 NOTE — Discharge Instructions (Signed)
You were seen today and diagnosed with a kidney stone.  Kidney stone is approximately 5 mm and will likely pass on its own. You will be given follow-up for urology if you have any further issues. See return precautions below.  Kidney Stones Kidney stones (urolithiasis) are deposits that form inside your kidneys. The intense pain is caused by the stone moving through the urinary tract. When the stone moves, the ureter goes into spasm around the stone. The stone is usually passed in the urine.  CAUSES   A disorder that makes certain neck glands produce too much parathyroid hormone (primary hyperparathyroidism).  A buildup of uric acid crystals, similar to gout in your joints.  Narrowing (stricture) of the ureter.  A kidney obstruction present at birth (congenital obstruction).  Previous surgery on the kidney or ureters.  Numerous kidney infections. SYMPTOMS   Feeling sick to your stomach (nauseous).  Throwing up (vomiting).  Blood in the urine (hematuria).  Pain that usually spreads (radiates) to the groin.  Frequency or urgency of urination. DIAGNOSIS   Taking a history and physical exam.  Blood or urine tests.  CT scan.  Occasionally, an examination of the inside of the urinary bladder (cystoscopy) is performed. TREATMENT   Observation.  Increasing your fluid intake.  Extracorporeal shock wave lithotripsy--This is a noninvasive procedure that uses shock waves to break up kidney stones.  Surgery may be needed if you have severe pain or persistent obstruction. There are various surgical procedures. Most of the procedures are performed with the use of small instruments. Only small incisions are needed to accommodate these instruments, so recovery time is minimized. The size, location, and chemical composition are all important variables that will determine the proper choice of action for you. Talk to your health care provider to better understand your situation so that  you will minimize the risk of injury to yourself and your kidney.  HOME CARE INSTRUCTIONS   Drink enough water and fluids to keep your urine clear or pale yellow. This will help you to pass the stone or stone fragments.  Strain all urine through the provided strainer. Keep all particulate matter and stones for your health care provider to see. The stone causing the pain may be as small as a grain of salt. It is very important to use the strainer each and every time you pass your urine. The collection of your stone will allow your health care provider to analyze it and verify that a stone has actually passed. The stone analysis will often identify what you can do to reduce the incidence of recurrences.  Only take over-the-counter or prescription medicines for pain, discomfort, or fever as directed by your health care provider.  Make a follow-up appointment with your health care provider as directed.  Get follow-up X-rays if required. The absence of pain does not always mean that the stone has passed. It may have only stopped moving. If the urine remains completely obstructed, it can cause loss of kidney function or even complete destruction of the kidney. It is your responsibility to make sure X-rays and follow-ups are completed. Ultrasounds of the kidney can show blockages and the status of the kidney. Ultrasounds are not associated with any radiation and can be performed easily in a matter of minutes. SEEK MEDICAL CARE IF:  You experience pain that is progressive and unresponsive to any pain medicine you have been prescribed. SEEK IMMEDIATE MEDICAL CARE IF:   Pain cannot be controlled with the prescribed medicine.  You have a fever or shaking chills.  The severity or intensity of pain increases over 18 hours and is not relieved by pain medicine.  You develop a new onset of abdominal pain.  You feel faint or pass out.  You are unable to urinate. MAKE SURE YOU:   Understand these  instructions.  Will watch your condition.  Will get help right away if you are not doing well or get worse. Document Released: 09/30/2005 Document Revised: 06/02/2013 Document Reviewed: 03/03/2013 [facility] Patient Information 2015 [place], [place]. This information is not intended to replace advice given to you by your health care provider. Make sure you discuss any questions you have with your health care provider.

## 2015-03-16 NOTE — ED Notes (Signed)
Pt tolerated PO fluids

## 2015-03-16 NOTE — ED Notes (Signed)
Report received from Keli Conner, RN, chart reviewed and care assumed. 

## 2015-03-16 NOTE — ED Provider Notes (Signed)
Pt improving She is taking PO fluids Discussed urology followup  Zadie Rhineonald Providence Stivers, MD 03/16/15 [phone]

## 2015-03-16 NOTE — ED Notes (Signed)
MD at bedside. 

## 2015-03-16 NOTE — ED Notes (Signed)
Patient is sitting up on side of bed vomiting at this time. The patient reports that her pain has decreased. Patient given zofran ODT - and patient ambulatory to the restroom

## 2016-11-25 ENCOUNTER — Other Ambulatory Visit (HOSPITAL_BASED_OUTPATIENT_CLINIC_OR_DEPARTMENT_OTHER): Payer: Self-pay | Admitting: Obstetrics & Gynecology

## 2016-11-25 DIAGNOSIS — Z1231 Encounter for screening mammogram for malignant neoplasm of breast: Secondary | ICD-10-CM

## 2016-11-26 ENCOUNTER — Encounter (HOSPITAL_BASED_OUTPATIENT_CLINIC_OR_DEPARTMENT_OTHER): Payer: Self-pay

## 2016-11-26 ENCOUNTER — Ambulatory Visit (HOSPITAL_BASED_OUTPATIENT_CLINIC_OR_DEPARTMENT_OTHER)
Admission: RE | Admit: 2016-11-26 | Discharge: 2016-11-26 | Disposition: A | Discharge status: Home / Self Care | Payer: 59 | Source: Ambulatory Visit | Attending: Obstetrics & Gynecology | Admitting: Obstetrics & Gynecology

## 2016-11-26 ENCOUNTER — Other Ambulatory Visit (HOSPITAL_BASED_OUTPATIENT_CLINIC_OR_DEPARTMENT_OTHER): Payer: Self-pay | Admitting: Obstetrics & Gynecology

## 2016-11-26 DIAGNOSIS — Z1231 Encounter for screening mammogram for malignant neoplasm of breast: Secondary | ICD-10-CM | POA: Diagnosis present

## 2016-11-26 IMAGING — MG DIGITAL SCREENING BILATERAL MAMMOGRAM WITH CAD
6 series · 6 of 6 positions shown · non-contrast
Comparison: Previous exam(s).

CLINICAL DATA: Screening.

EXAM:
DIGITAL SCREENING BILATERAL MAMMOGRAM WITH CAD

[R CC]
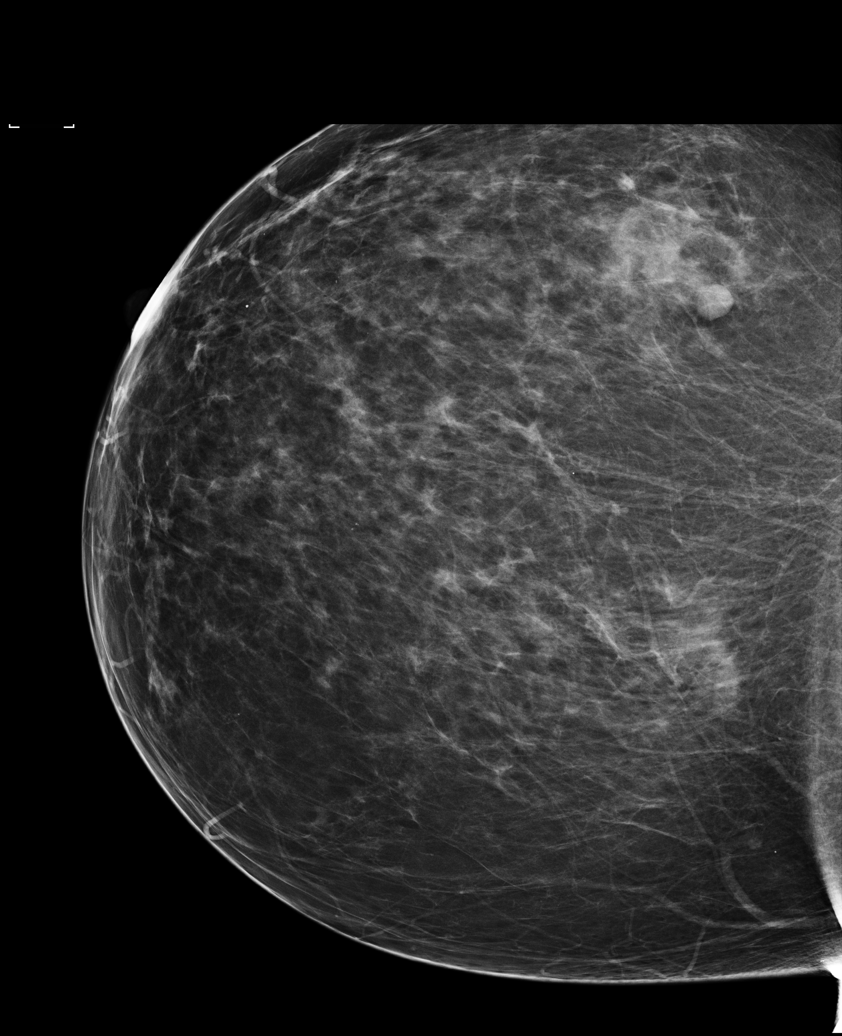

[R MLO (1 of 2)]
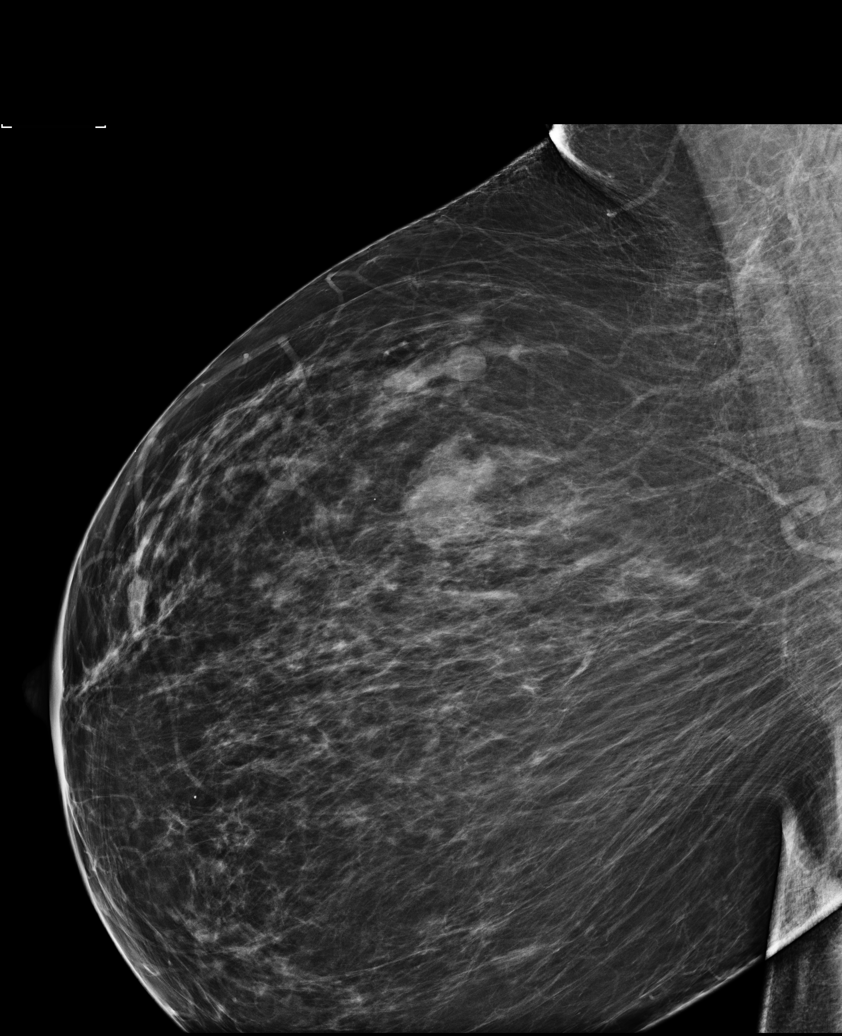

[L CC]
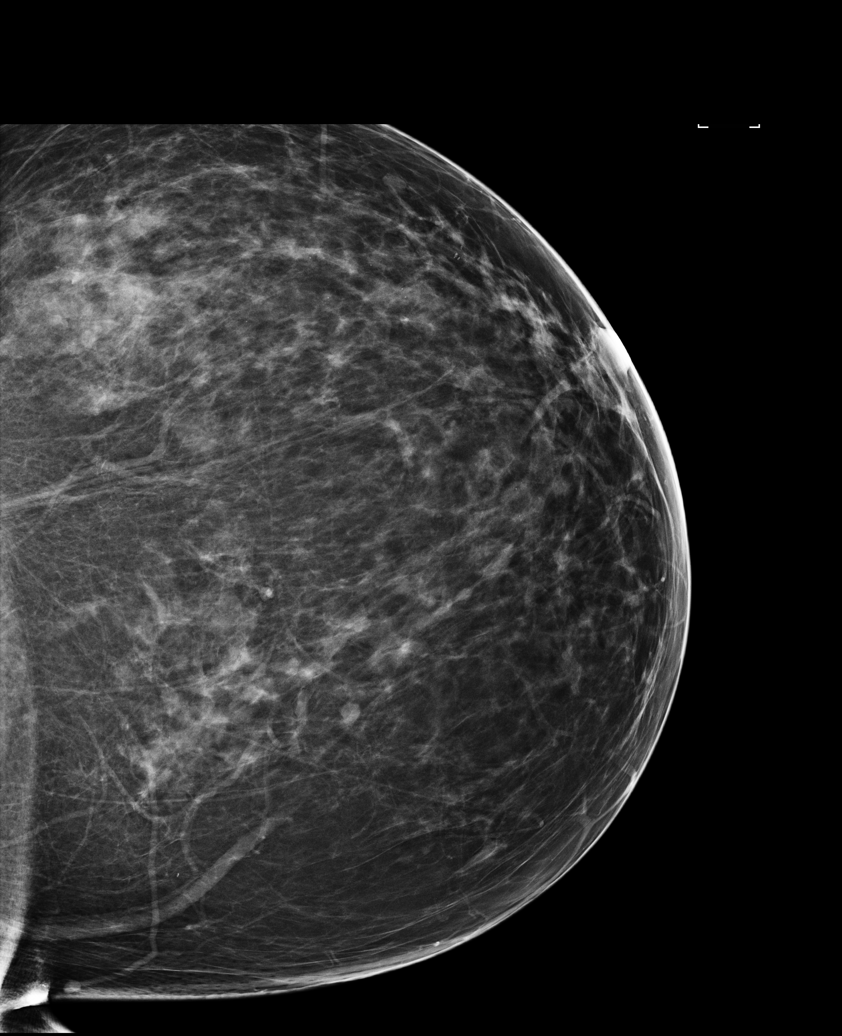

[L MLO (1 of 2)]
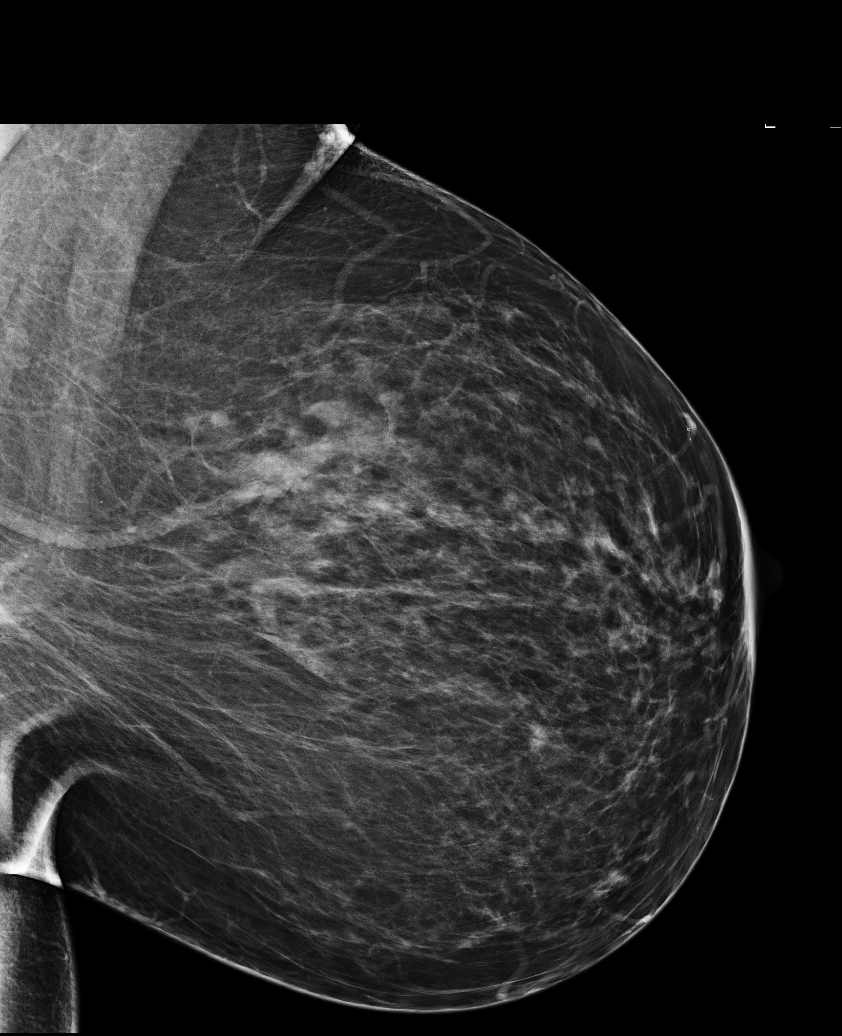

[L MLO (2 of 2)]
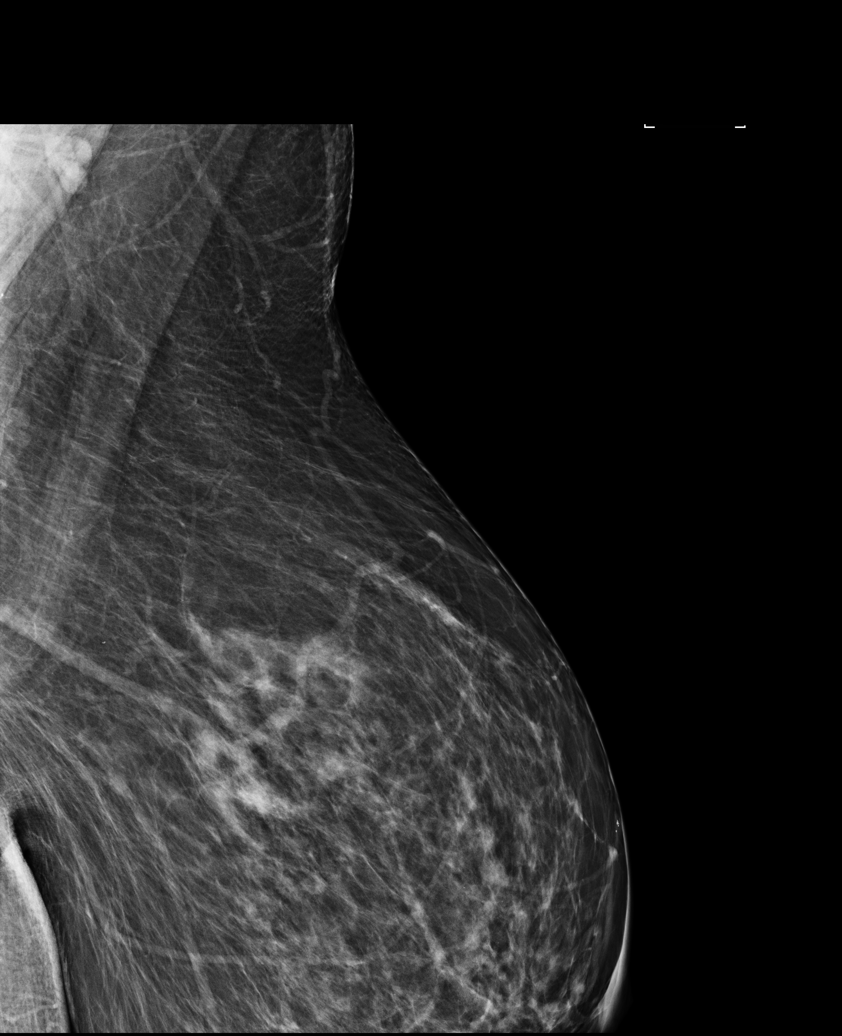

[R MLO (2 of 2)]
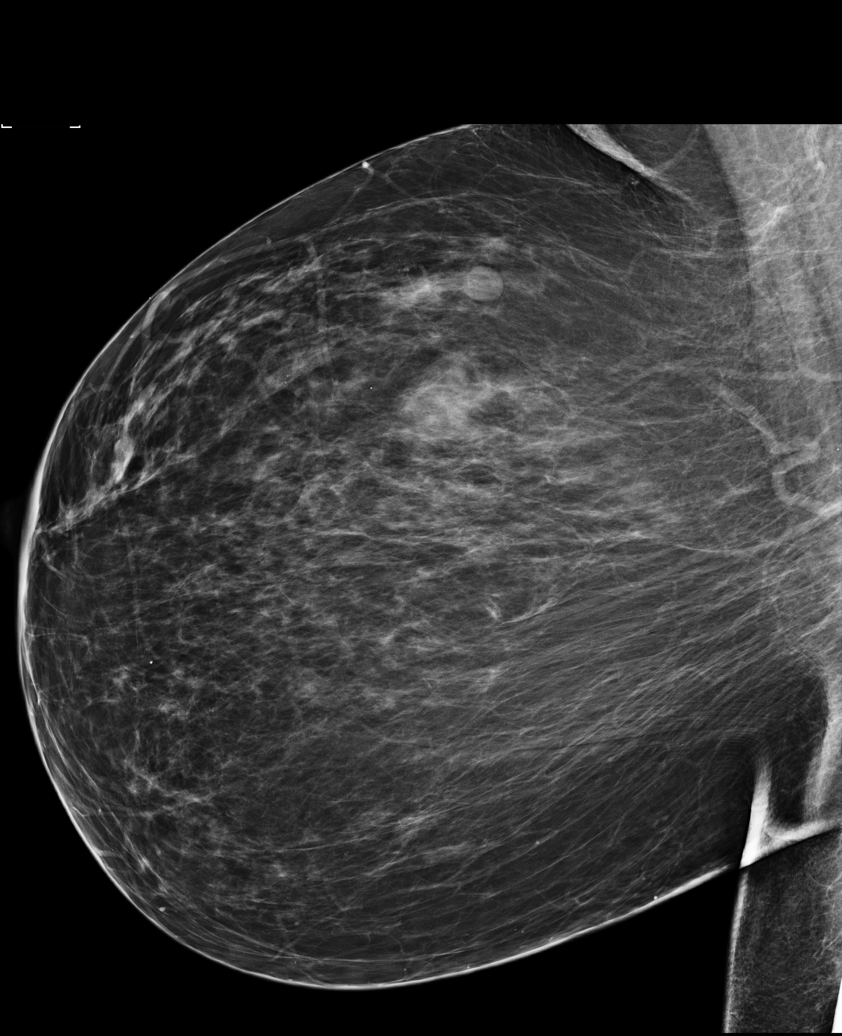

[6 of 6 positions shown; findings below may reference images not displayed]

ACR Breast Density Category b: There are scattered areas of
fibroglandular density.
FINDINGS: In the right breast, a possible mass warrants further evaluation. In
the left breast, no findings suspicious for malignancy. Images were
processed with CAD.
IMPRESSION: Further evaluation is suggested for possible mass in the right
breast.

RECOMMENDATION:
Diagnostic mammogram and possibly ultrasound of the right breast.
(Code:[id number])

The patient will be contacted regarding the findings, and additional
imaging will be scheduled.

BI-RADS CATEGORY  0: Incomplete. Need additional imaging evaluation
and/or prior mammograms for comparison.

## 2016-11-28 ENCOUNTER — Other Ambulatory Visit: Payer: Self-pay | Admitting: Obstetrics & Gynecology

## 2016-11-28 DIAGNOSIS — R928 Other abnormal and inconclusive findings on diagnostic imaging of breast: Secondary | ICD-10-CM

## 2016-12-03 ENCOUNTER — Ambulatory Visit
Admission: RE | Admit: 2016-12-03 | Discharge: 2016-12-03 | Disposition: A | Discharge status: Home / Self Care | Payer: 59 | Source: Ambulatory Visit | Attending: Obstetrics & Gynecology | Admitting: Obstetrics & Gynecology

## 2016-12-03 DIAGNOSIS — R928 Other abnormal and inconclusive findings on diagnostic imaging of breast: Secondary | ICD-10-CM

## 2016-12-03 IMAGING — US ULTRASOUND RIGHT BREAST LIMITED
1 series · 11 of 11 positions shown · non-contrast
Comparison: Mammography 11/26/2016, 08/06/2012.

CLINICAL DATA: Recall from 2D screening mammography, possible mass
in the upper outer right breast at posterior depth.

EXAM:
2D DIGITAL DIAGNOSTIC RIGHT MAMMOGRAM WITH CAD AND ADJUNCT TOMO
ULTRASOUND RIGHT BREAST

[Series 1: ultrasound right breast limited · 0.07mm/px · 11 of 11 slices shown]
[im 1/11]
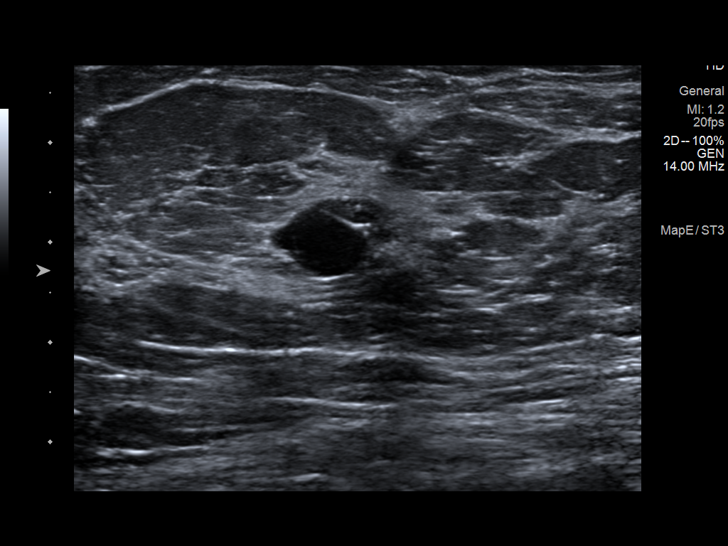
[im 2/11]
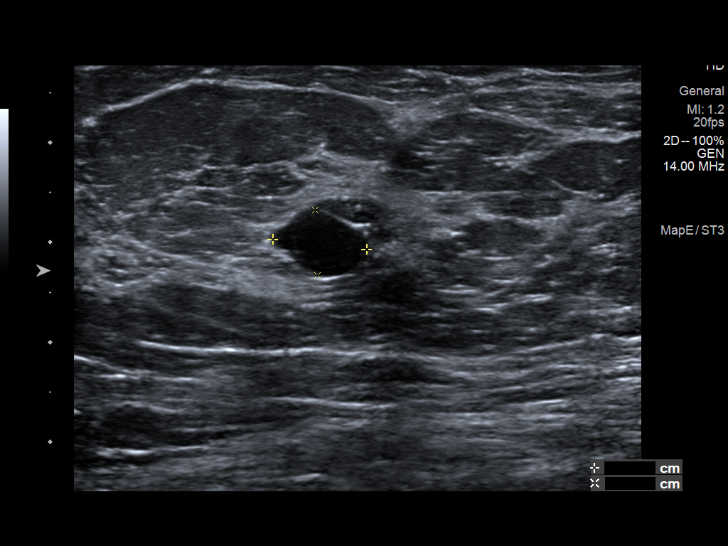
[im 3/11]
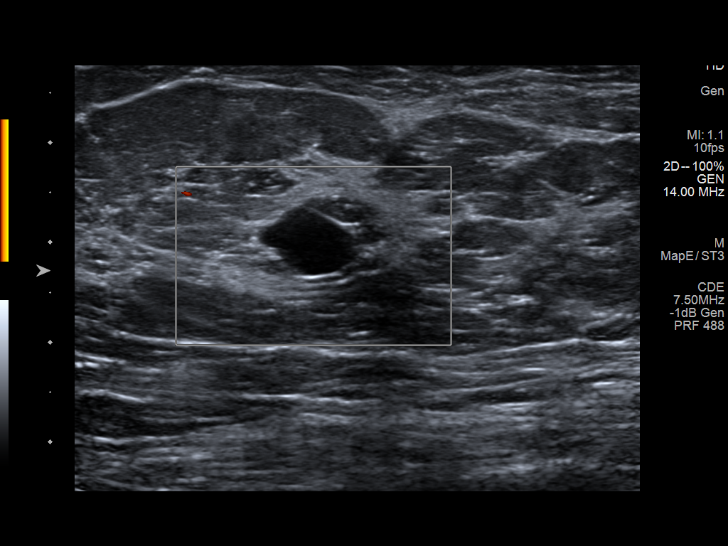
[im 4/11]
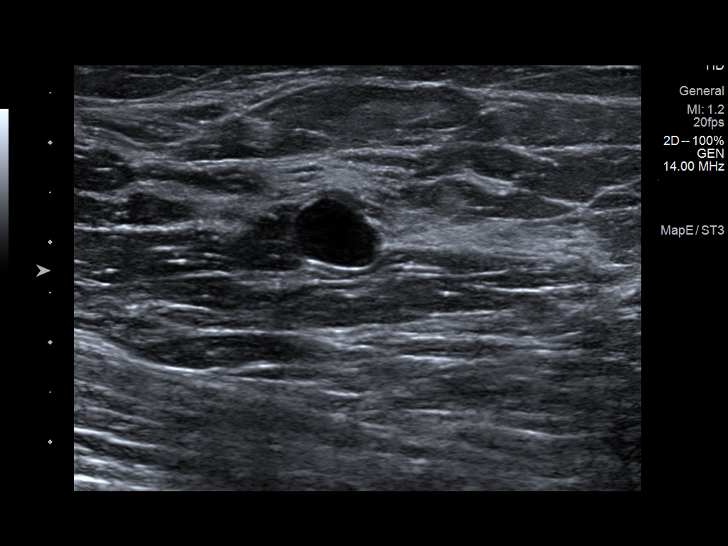
[im 5/11]
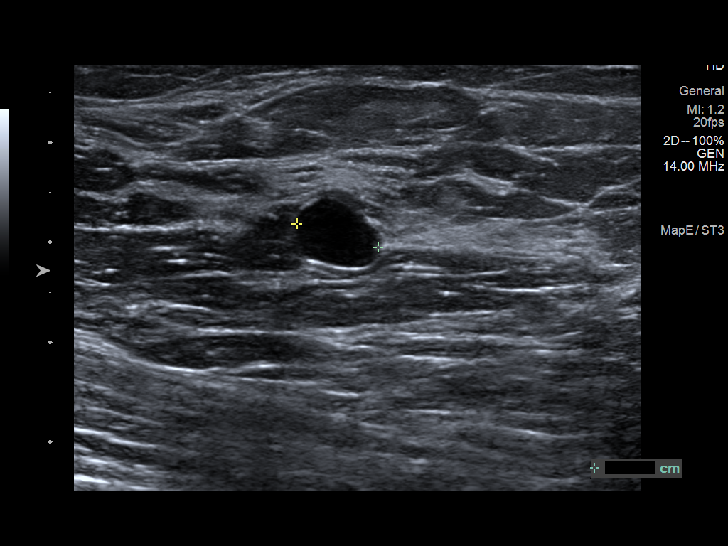
[im 6/11]
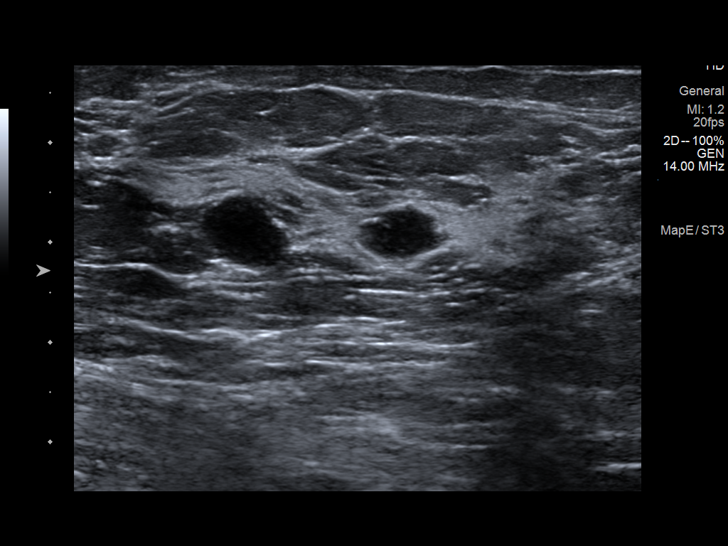
[im 7/11]
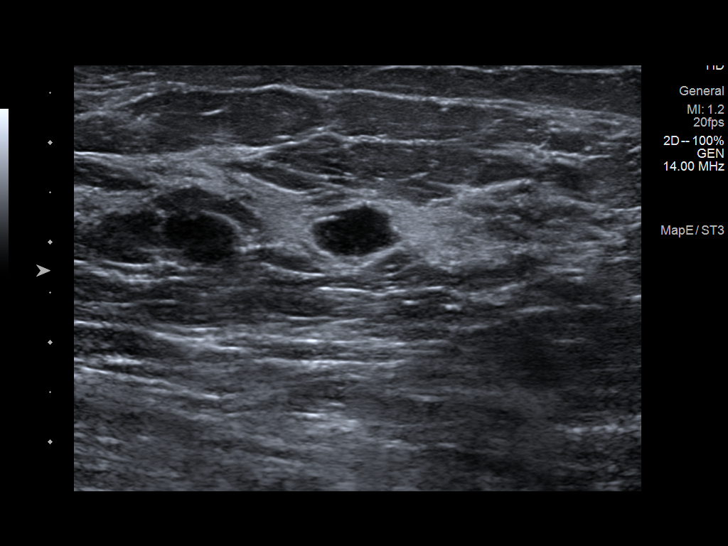
[im 8/11]
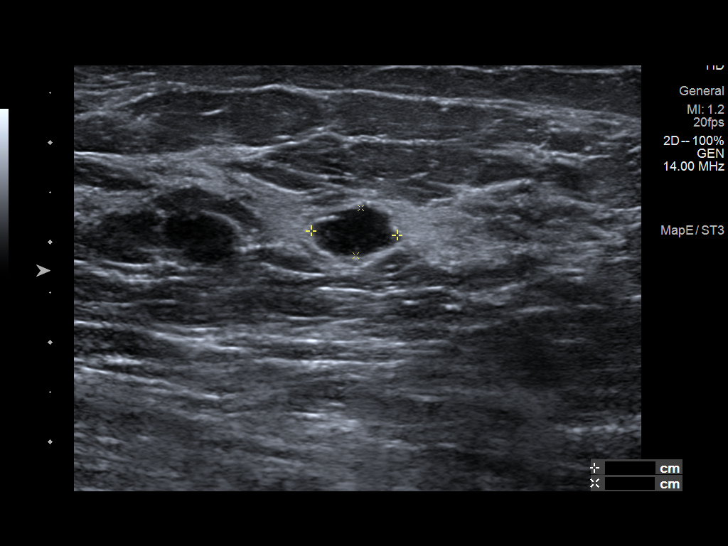
[im 9/11]
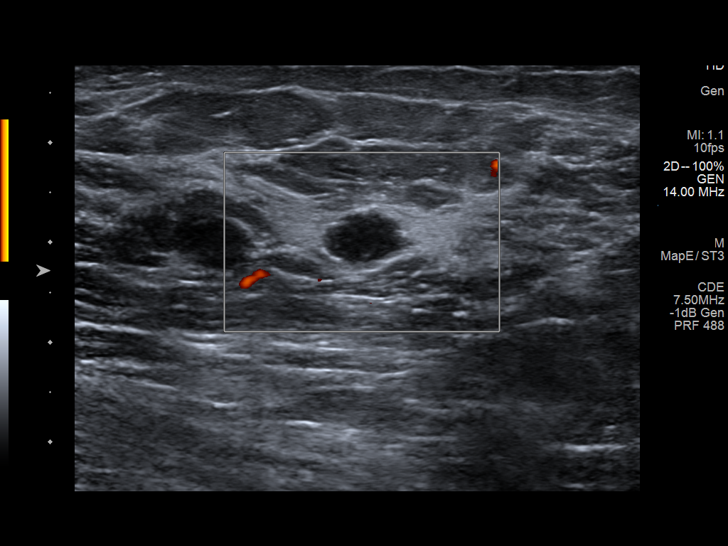
[im 10/11]
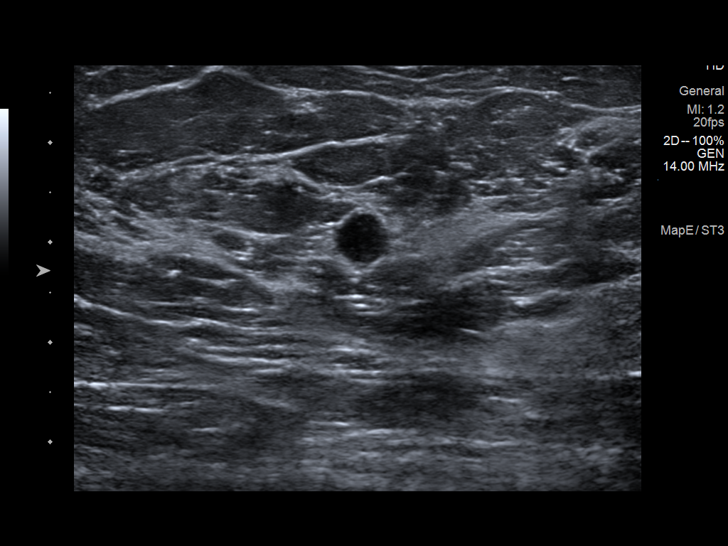
[im 11/11]
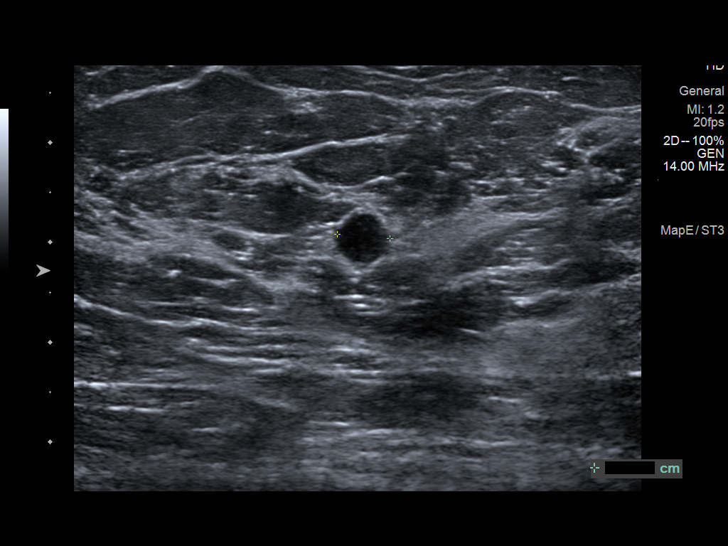

[11 of 11 positions shown; findings below may reference images not displayed]

No prior right
breast ultrasound.

ACR Breast Density Category b: There are scattered areas of
fibroglandular density.
FINDINGS: Standard 2D and tomosynthesis full field CC and MLO views of the
right breast were obtained.

Tomosynthesis images confirm a circumscribed low-density mass in the
upper outer quadrant posterior depth measuring approximately 1 cm.
There is no associated architectural distortion or suspicious
calcification. No suspicious findings elsewhere in the right breast.

Mammographic images were processed with CAD.

On physical exam, there is no palpable abnormality in the upper
outer right breast.

Targeted right breast ultrasound is performed, showing an oval
parallel circumscribed anechoic mass with acoustic enhancement and
no internal power Doppler flow at the 10 o'clock position
approximately 8 cm from the nipple measuring approximately 7 x 10 x
9 mm, corresponding to the area of concern on screening mammography.

Immediately adjacent to this mass at the 10 o'clock position
approximately 8 cm from the nipple is an oval circumscribed parallel
anechoic mass with internal echoes, demonstrating acoustic
enhancement and no internal power Doppler flow, measuring
approximately 4 x 9 x 9 mm. This mass was obscured by the island of
dense fibroglandular tissue on the mammogram.

No suspicious solid mass or abnormal acoustic shadowing is
identified.
IMPRESSION: Benign cysts in the upper outer quadrant of the right breast, the
larger of which accounts for the area of concern on screening
mammography. No mammographic or sonographic evidence of malignancy,
right breast.

RECOMMENDATION:
Screening mammogram in one year.(Code:YB-[id number])

I have discussed the findings and recommendations with the patient.
Results were also provided in writing at the conclusion of the
visit. If applicable, a reminder letter will be sent to the patient
regarding the next appointment.

BI-RADS CATEGORY  2: Benign.

## 2016-12-03 IMAGING — MG 2D DIGITAL DIAGNOSTIC UNILATERAL RIGHT MAMMOGRAM WITH CAD AND AD
6 series · 6 of 14 positions shown · non-contrast
Comparison: Mammography 11/26/2016, 08/06/2012.

CLINICAL DATA: Recall from 2D screening mammography, possible mass
in the upper outer right breast at posterior depth.

EXAM:
2D DIGITAL DIAGNOSTIC RIGHT MAMMOGRAM WITH CAD AND ADJUNCT TOMO
ULTRASOUND RIGHT BREAST

[R MLO]
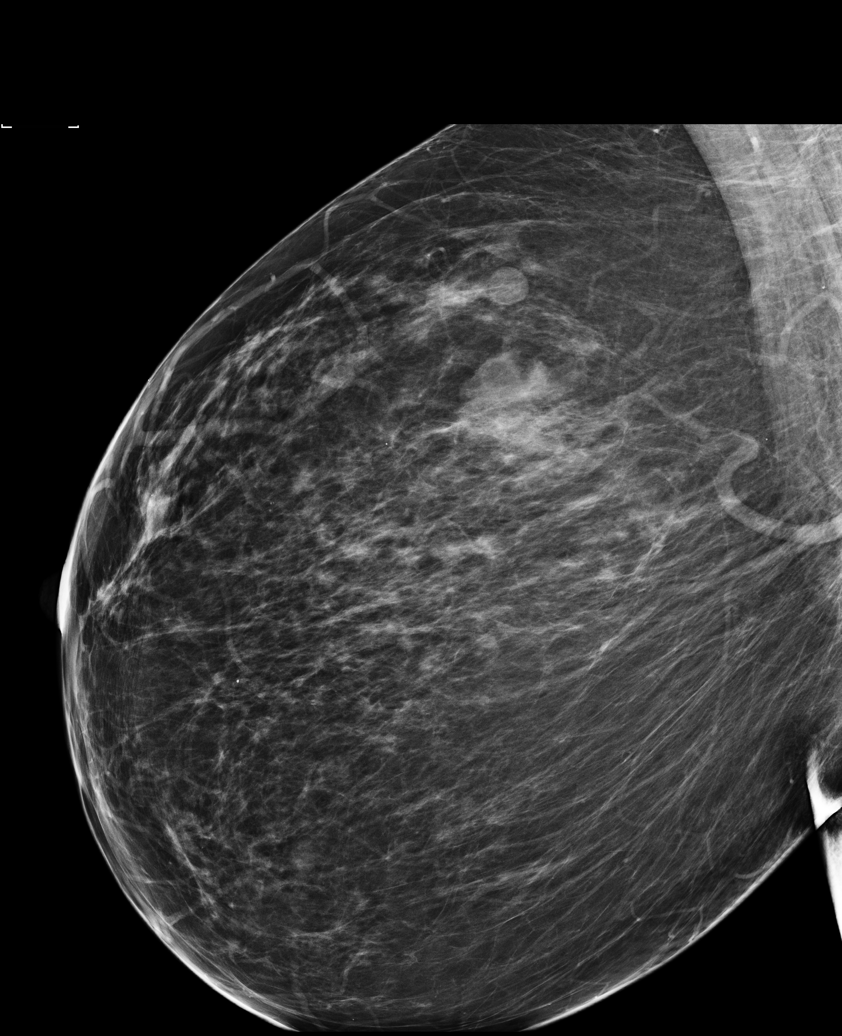

[R CC]
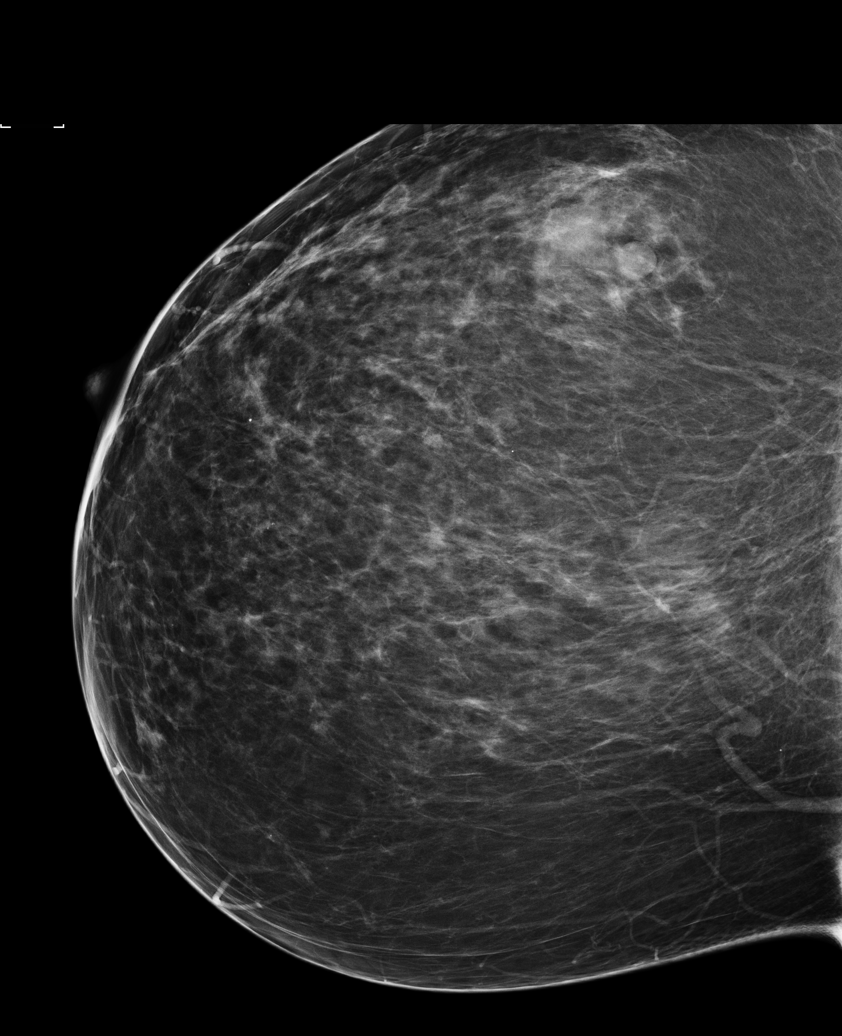

[R MLO synth-2D]
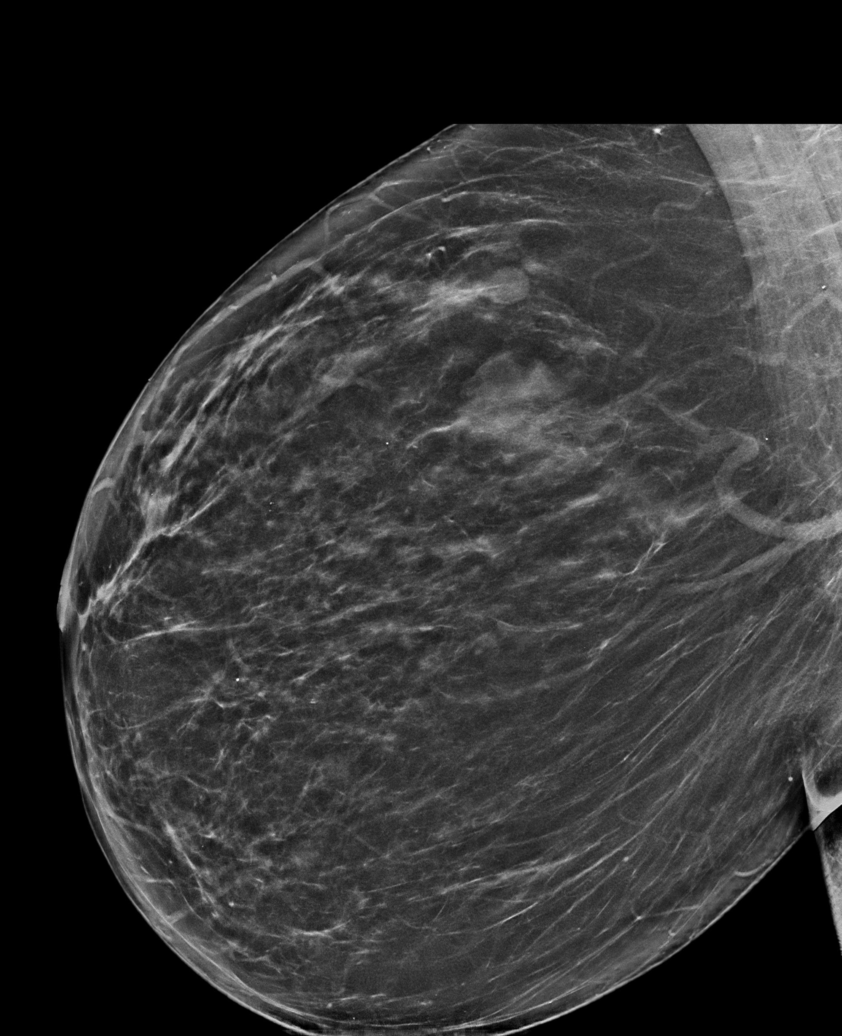

[R CC synth-2D]
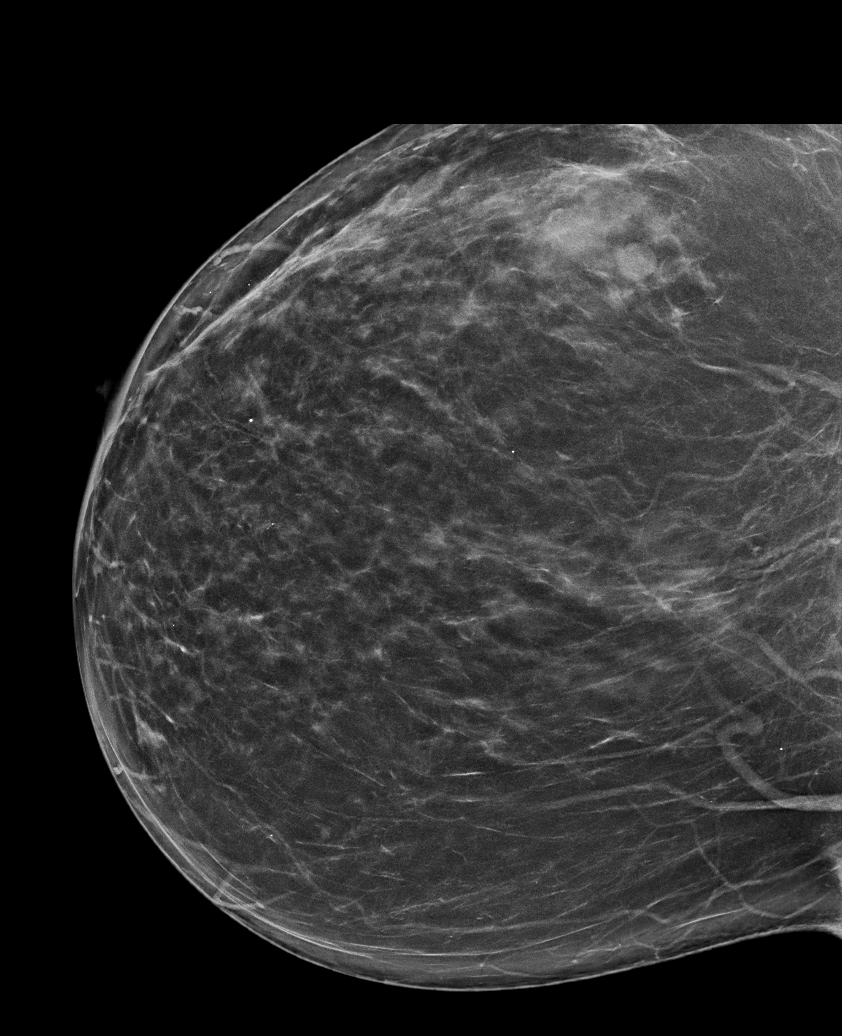

[R MLO tomo · tomo slice 44/87.0]
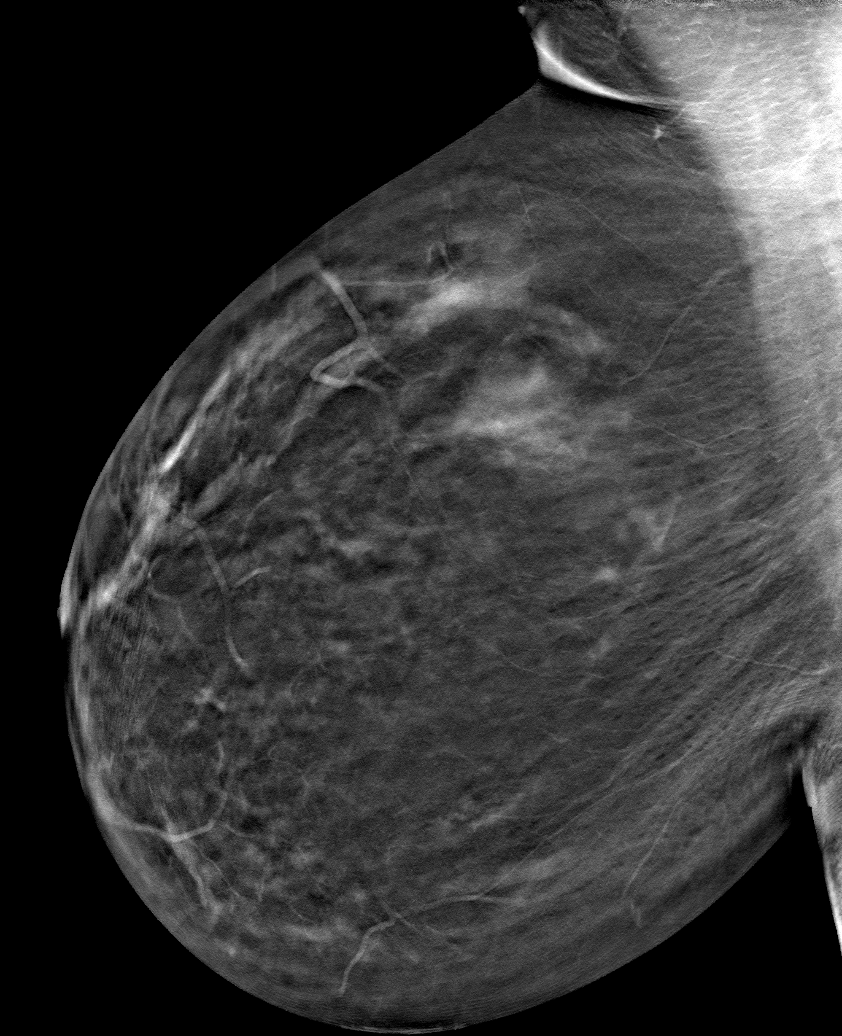

[R CC tomo · tomo slice 41/82.0]
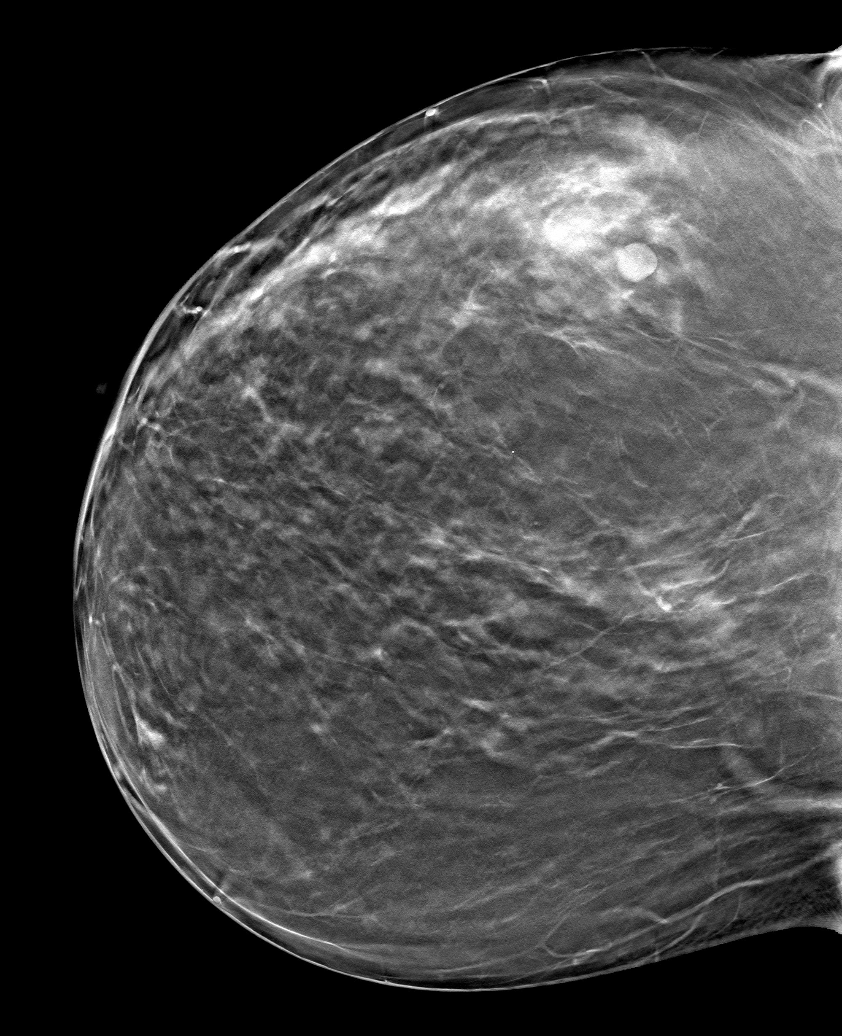

[6 of 14 positions shown; findings below may reference images not displayed]

No prior right
breast ultrasound.

ACR Breast Density Category b: There are scattered areas of
fibroglandular density.
FINDINGS: Standard 2D and tomosynthesis full field CC and MLO views of the
right breast were obtained.

Tomosynthesis images confirm a circumscribed low-density mass in the
upper outer quadrant posterior depth measuring approximately 1 cm.
There is no associated architectural distortion or suspicious
calcification. No suspicious findings elsewhere in the right breast.

Mammographic images were processed with CAD.

On physical exam, there is no palpable abnormality in the upper
outer right breast.

Targeted right breast ultrasound is performed, showing an oval
parallel circumscribed anechoic mass with acoustic enhancement and
no internal power Doppler flow at the 10 o'clock position
approximately 8 cm from the nipple measuring approximately 7 x 10 x
9 mm, corresponding to the area of concern on screening mammography.

Immediately adjacent to this mass at the 10 o'clock position
approximately 8 cm from the nipple is an oval circumscribed parallel
anechoic mass with internal echoes, demonstrating acoustic
enhancement and no internal power Doppler flow, measuring
approximately 4 x 9 x 9 mm. This mass was obscured by the island of
dense fibroglandular tissue on the mammogram.

No suspicious solid mass or abnormal acoustic shadowing is
identified.
IMPRESSION: Benign cysts in the upper outer quadrant of the right breast, the
larger of which accounts for the area of concern on screening
mammography. No mammographic or sonographic evidence of malignancy,
right breast.

RECOMMENDATION:
Screening mammogram in one year.(Code:YB-[id number])

I have discussed the findings and recommendations with the patient.
Results were also provided in writing at the conclusion of the
visit. If applicable, a reminder letter will be sent to the patient
regarding the next appointment.

BI-RADS CATEGORY  2: Benign.

## 2018-01-01 ENCOUNTER — Other Ambulatory Visit (HOSPITAL_BASED_OUTPATIENT_CLINIC_OR_DEPARTMENT_OTHER): Payer: Self-pay | Admitting: Obstetrics & Gynecology

## 2018-01-01 DIAGNOSIS — Z1231 Encounter for screening mammogram for malignant neoplasm of breast: Secondary | ICD-10-CM

## 2018-01-13 ENCOUNTER — Encounter (HOSPITAL_BASED_OUTPATIENT_CLINIC_OR_DEPARTMENT_OTHER): Payer: Self-pay | Admitting: Radiology

## 2018-01-13 ENCOUNTER — Ambulatory Visit (HOSPITAL_BASED_OUTPATIENT_CLINIC_OR_DEPARTMENT_OTHER)
Admission: RE | Admit: 2018-01-13 | Discharge: 2018-01-13 | Disposition: A | Discharge status: Home / Self Care | Payer: 59 | Source: Ambulatory Visit | Attending: Obstetrics & Gynecology | Admitting: Obstetrics & Gynecology

## 2018-01-13 DIAGNOSIS — Z1231 Encounter for screening mammogram for malignant neoplasm of breast: Secondary | ICD-10-CM | POA: Insufficient documentation

## 2018-01-13 IMAGING — MG DIGITAL SCREENING BILATERAL MAMMOGRAM WITH TOMO AND CAD
6 of 10 series · 6 of 30 positions shown · non-contrast
Comparison: Previous exam(s).

CLINICAL DATA: Screening.

EXAM:
DIGITAL SCREENING BILATERAL MAMMOGRAM WITH TOMO AND CAD

[L CC synth-2D]
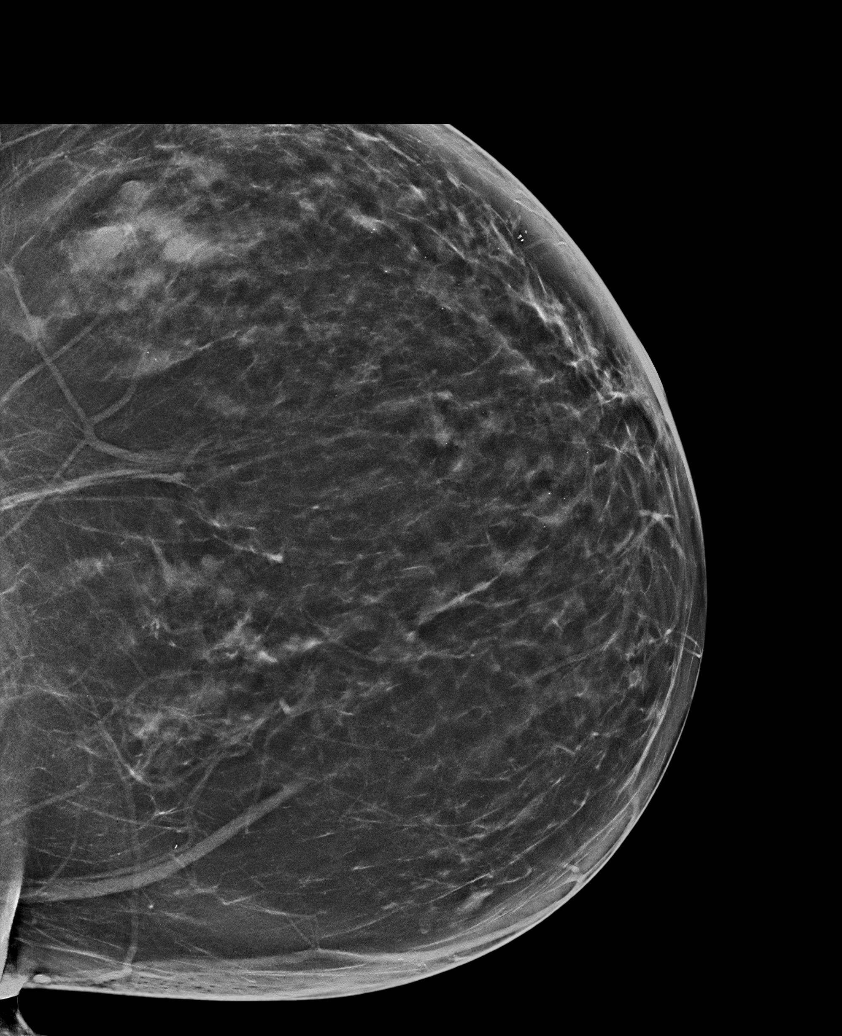

[R MLO synth-2D (1 of 2)]
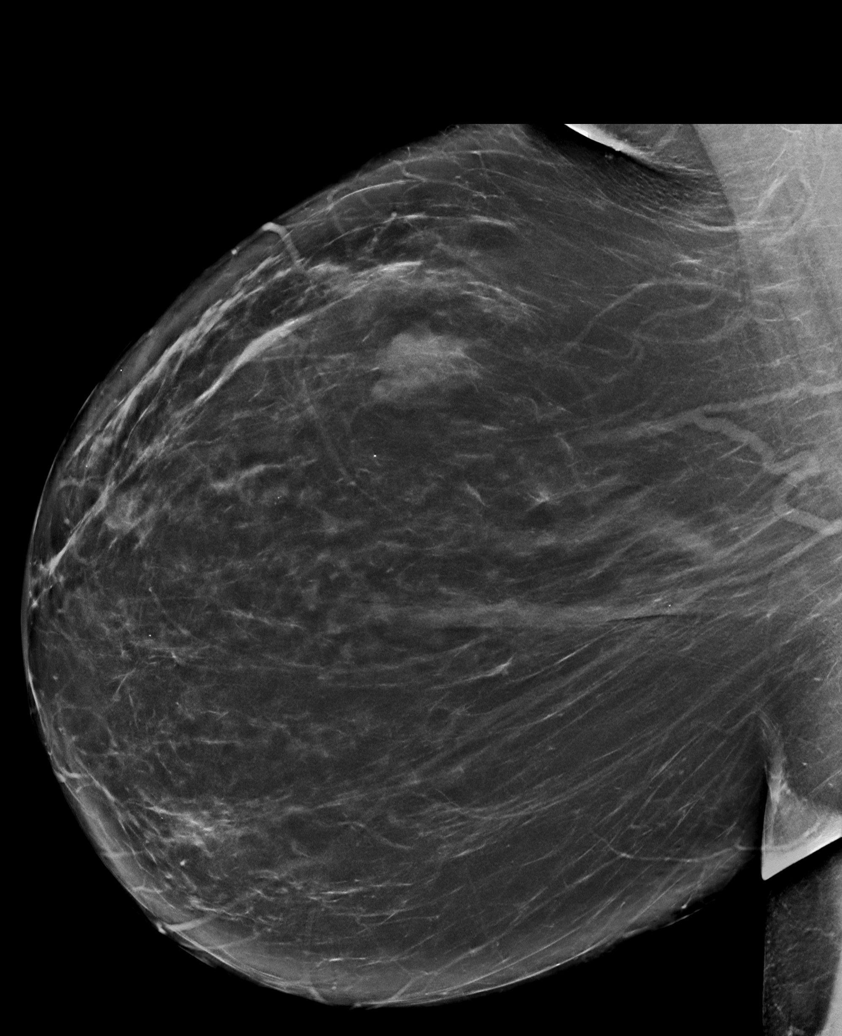

[R MLO synth-2D (2 of 2)]
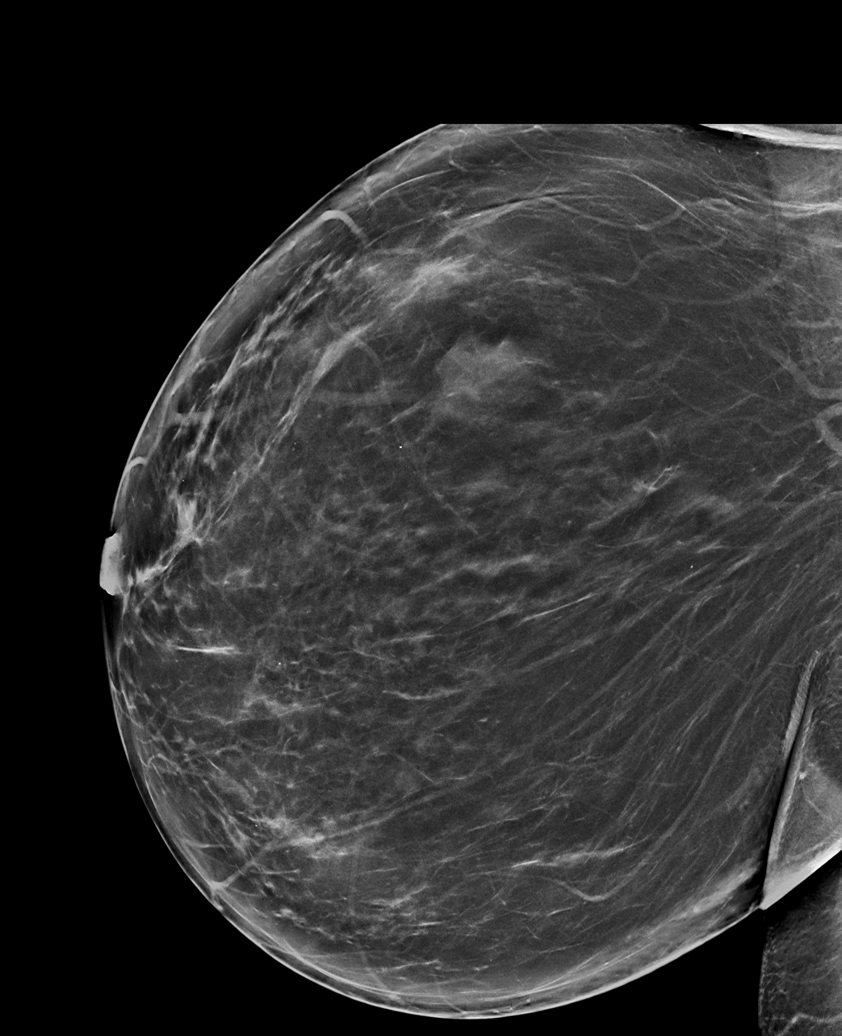

[L MLO synth-2D]
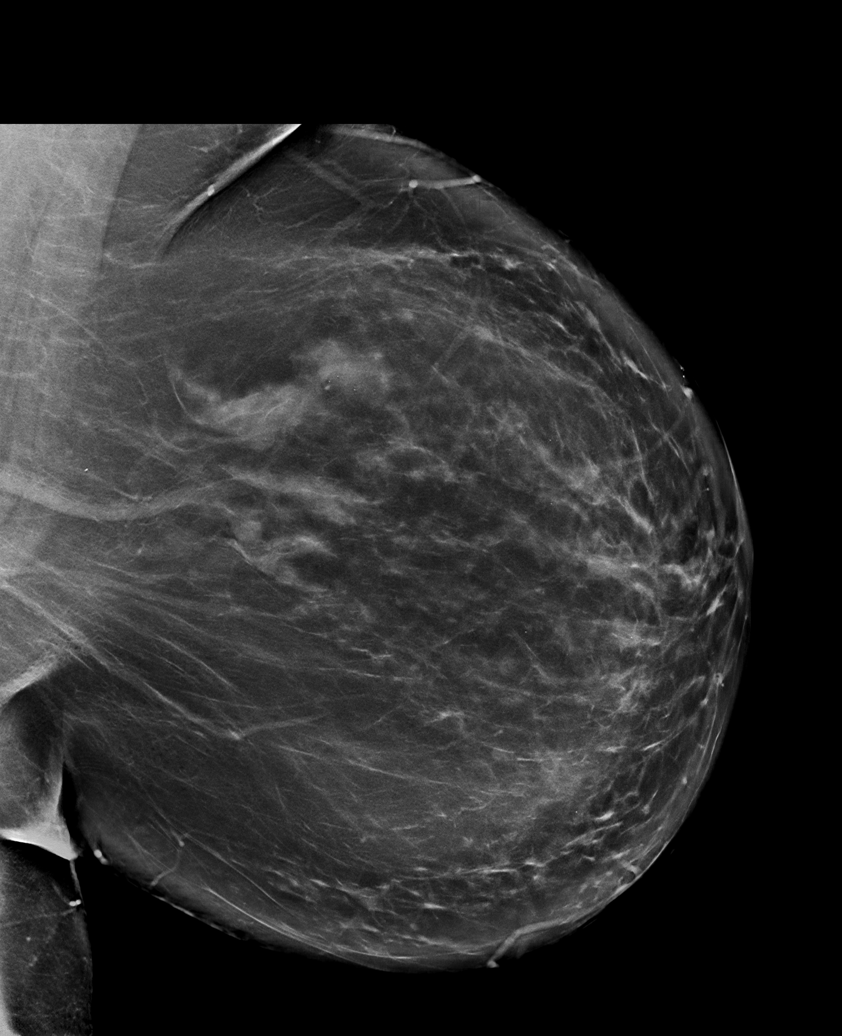

[R CC synth-2D]
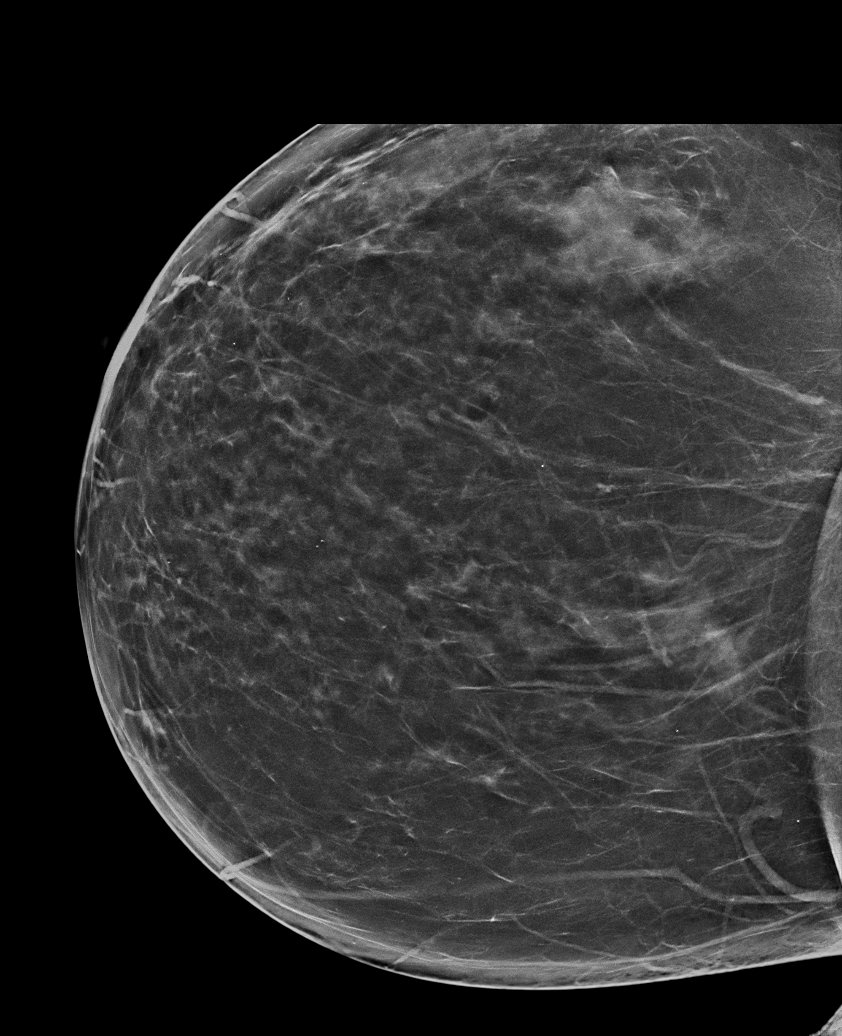

[R MLO tomo · tomo slice 49/97.0]
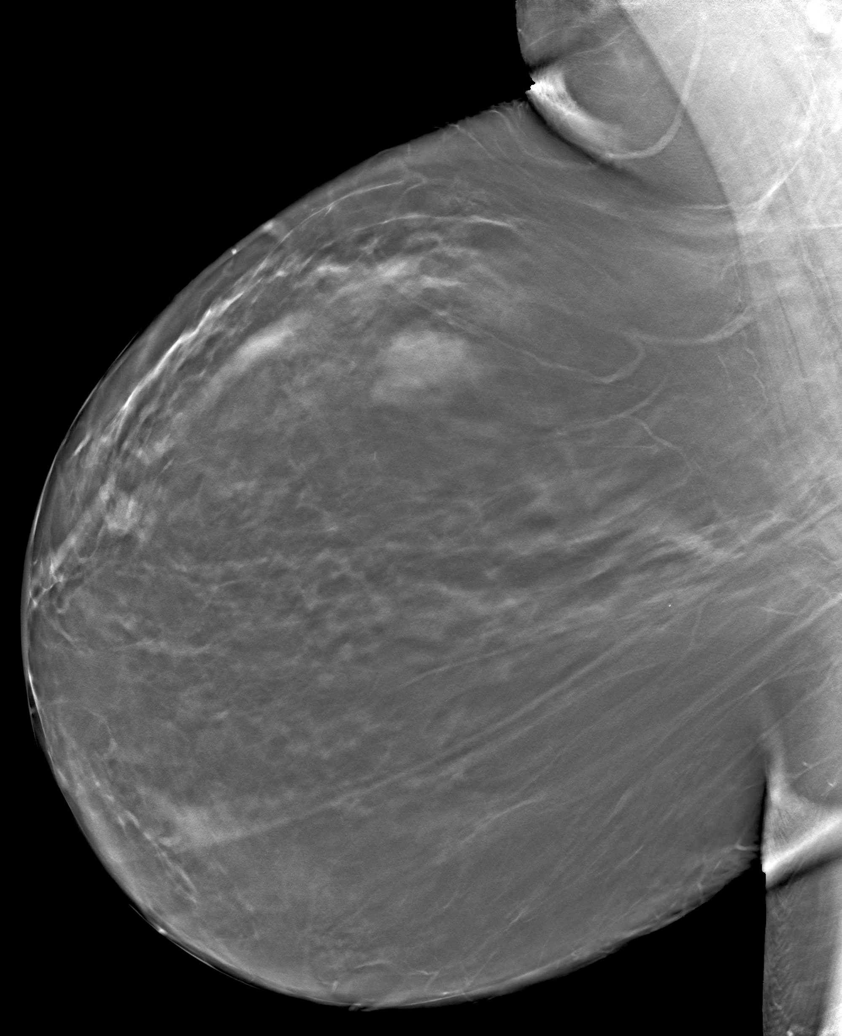

[6 of 30 positions shown; findings below may reference images not displayed]

ACR Breast Density Category b: There are scattered areas of
fibroglandular density.
FINDINGS: In the left breast, a possible mass warrants further evaluation. In
the right breast, no findings suspicious for malignancy.

Images were processed with CAD.
IMPRESSION: Further evaluation is suggested for possible mass in the left
breast.

RECOMMENDATION:
Diagnostic mammogram and possibly ultrasound of the left breast.
(Code:[id number])

The patient will be contacted regarding the findings, and additional
imaging will be scheduled.

BI-RADS CATEGORY  0: Incomplete. Need additional imaging evaluation
and/or prior mammograms for comparison.

## 2018-01-14 ENCOUNTER — Other Ambulatory Visit: Payer: Self-pay | Admitting: Obstetrics & Gynecology

## 2018-01-14 DIAGNOSIS — R928 Other abnormal and inconclusive findings on diagnostic imaging of breast: Secondary | ICD-10-CM

## 2018-01-19 ENCOUNTER — Ambulatory Visit
Admission: RE | Admit: 2018-01-19 | Discharge: 2018-01-19 | Disposition: A | Discharge status: Home / Self Care | Payer: 59 | Source: Ambulatory Visit | Attending: Obstetrics & Gynecology | Admitting: Obstetrics & Gynecology

## 2018-01-19 DIAGNOSIS — R928 Other abnormal and inconclusive findings on diagnostic imaging of breast: Secondary | ICD-10-CM

## 2018-01-19 IMAGING — US ULTRASOUND LEFT BREAST LIMITED
1 series · 6 of 6 positions shown · non-contrast
Comparison: January 13, 2018 and earlier priors

CLINICAL DATA: 46-year-old patient recalled from recent screening
mammogram for evaluation of a possible mass in the posterior third
of the outer left breast.

EXAM:
DIGITAL DIAGNOSTIC LEFT MAMMOGRAM WITH CAD AND TOMO
ULTRASOUND LEFT BREAST

[Series 1: ultrasound left breast limited · 0.08mm/px · 6 of 6 slices shown]
[im 1/6]
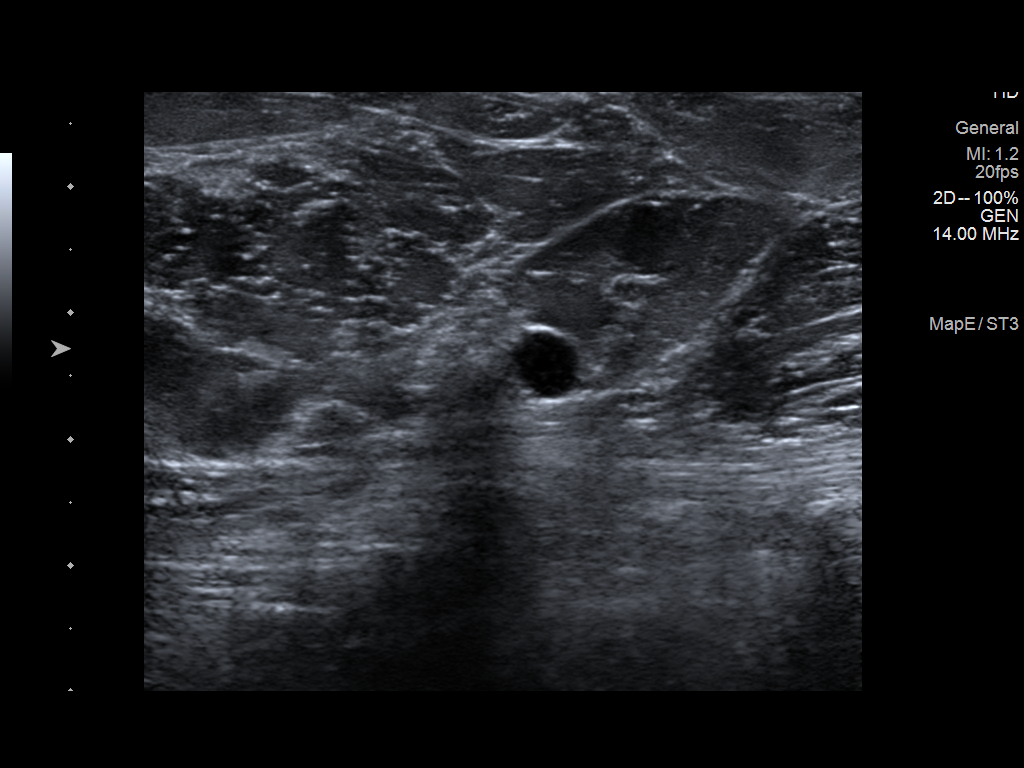
[im 2/6]
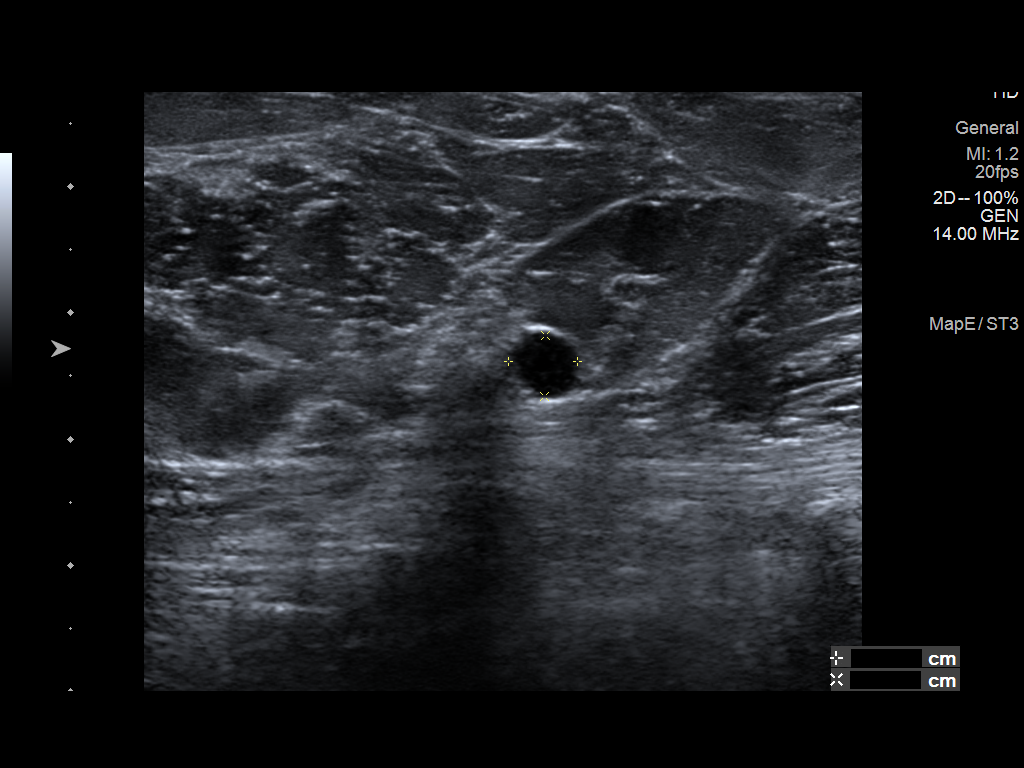
[im 3/6]
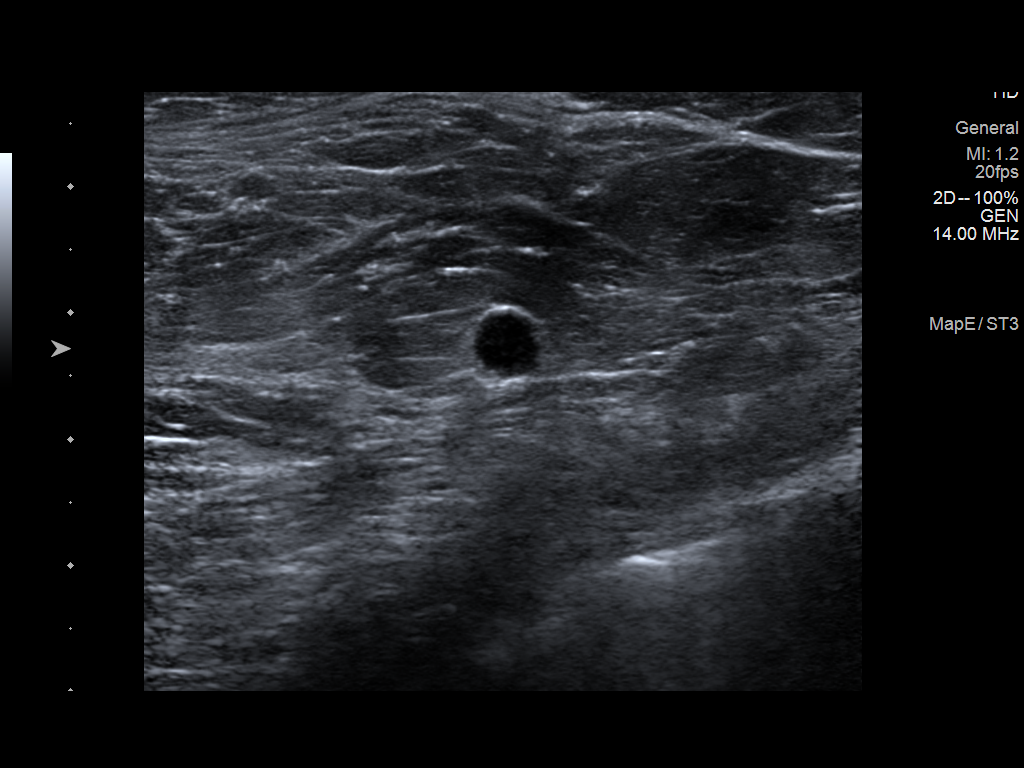
[im 4/6]
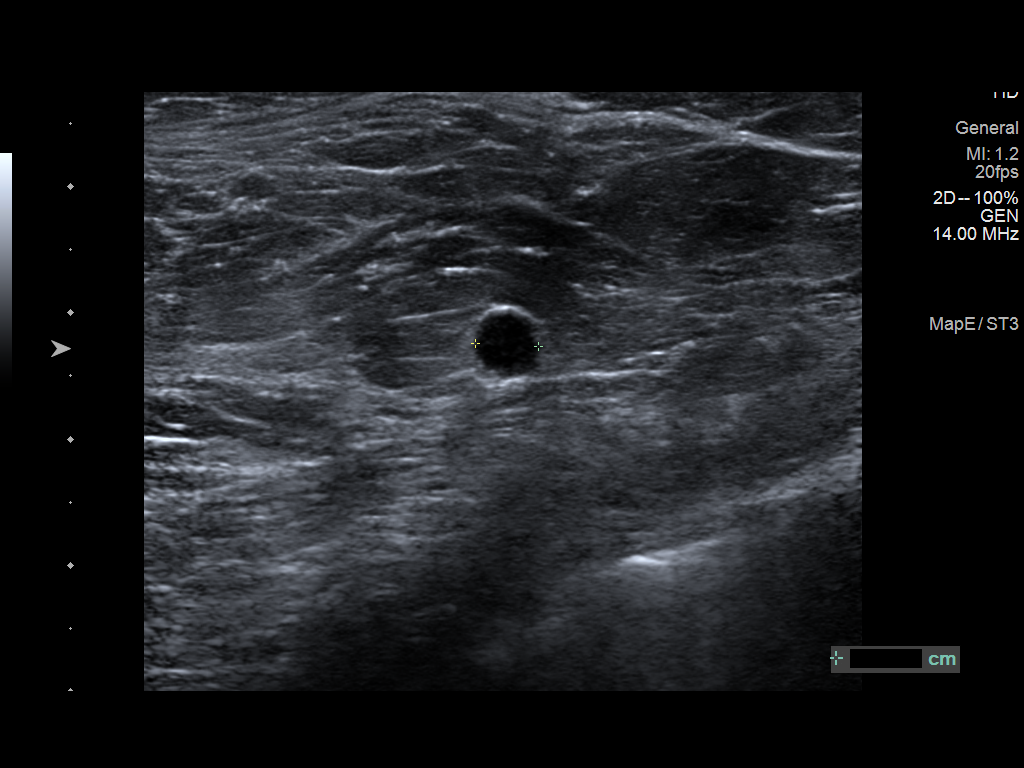
[im 5/6]
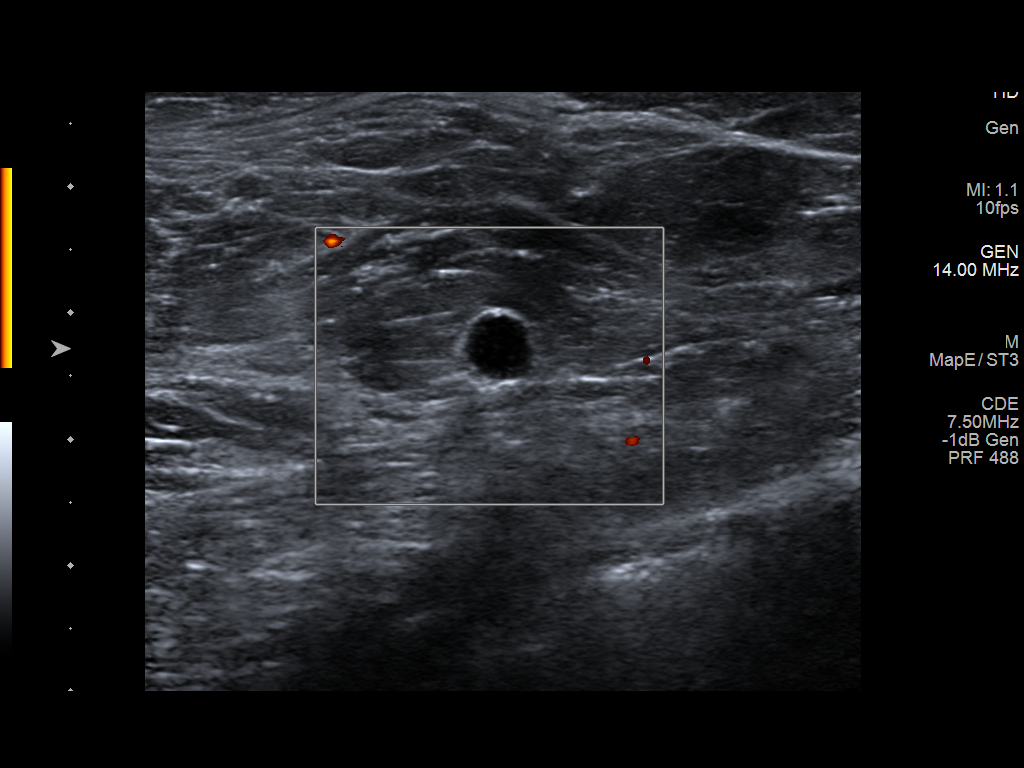
[im 6/6]
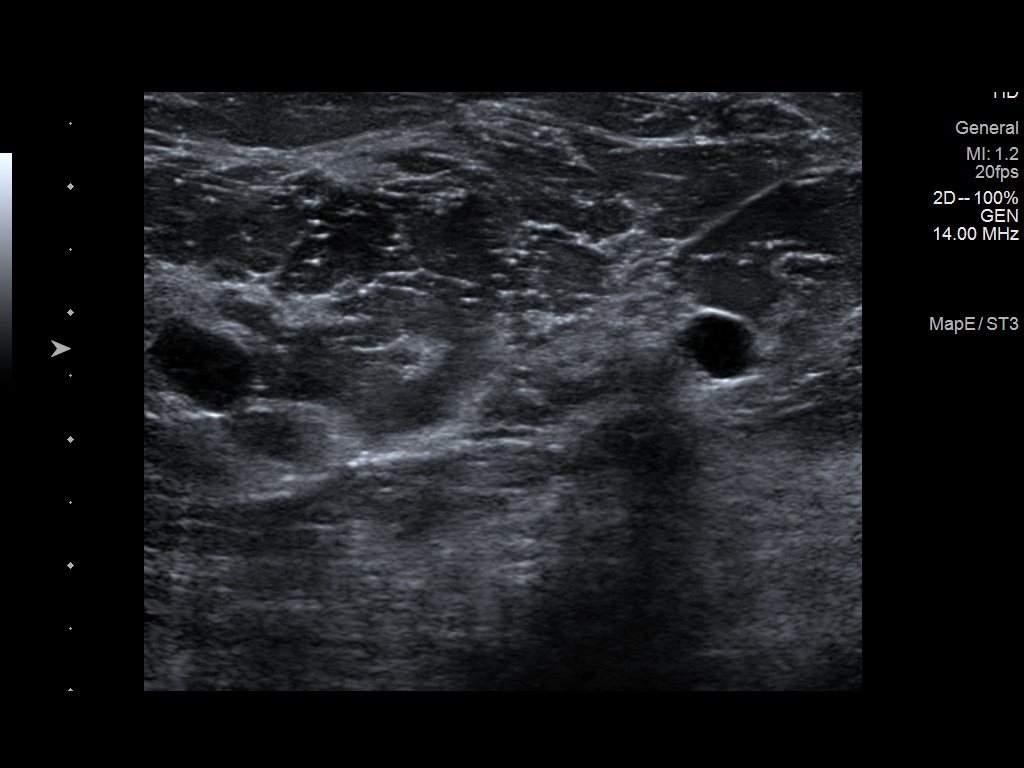

[6 of 6 positions shown; findings below may reference images not displayed]

ACR Breast Density Category b: There are scattered areas of
fibroglandular density.
FINDINGS: On focal spot compression view with tomography of the outer left
breast, the possible mass in question on the screening mammogram is
less apparent, and there is dispersion of fibroglandular breast
tissue. In the same region of the outer left breast are at least 2
subcentimeter circumscribed masses.

Mammographic images were processed with CAD.

Targeted ultrasound is performed, showing normal fibroglandular
tissue with 2 simple cyst in the [DATE] position 12 cm from the
nipple. No solid mass or abnormal shadowing is seen in the outer
left breast.
IMPRESSION: No evidence of malignancy. Two small benign cysts in the [DATE]
position of the left breast.

RECOMMENDATION:
Screening mammogram in one year.(Code:[id number])

I have discussed the findings and recommendations with the patient.
Results were also provided in writing at the conclusion of the
visit. If applicable, a reminder letter will be sent to the patient
regarding the next appointment.

BI-RADS CATEGORY  2: Benign.

## 2018-01-19 IMAGING — MG DIGITAL DIAGNOSTIC UNILATERAL LEFT MAMMOGRAM WITH TOMO AND CAD
6 series · 6 of 18 positions shown · non-contrast
Comparison: January 13, 2018 and earlier priors

CLINICAL DATA: 46-year-old patient recalled from recent screening
mammogram for evaluation of a possible mass in the posterior third
of the outer left breast.

EXAM:
DIGITAL DIAGNOSTIC LEFT MAMMOGRAM WITH CAD AND TOMO
ULTRASOUND LEFT BREAST

[L ML synth-2D]
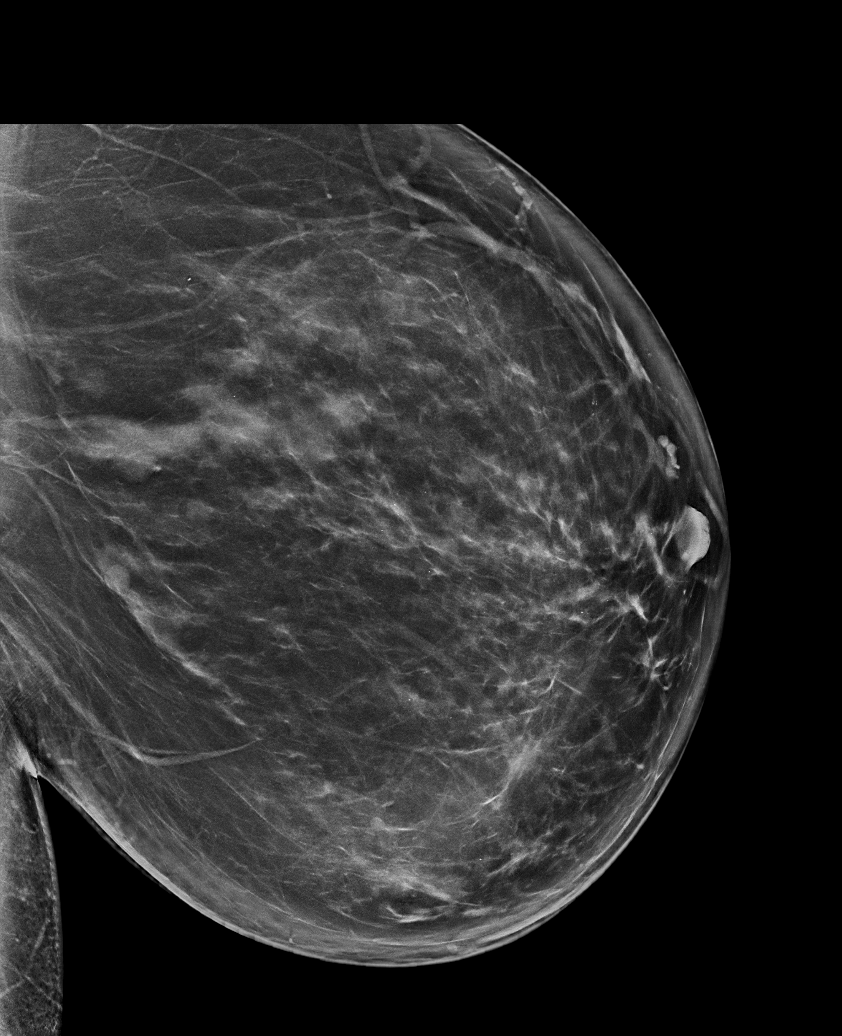

[L CC synth-2D (1 of 2)]
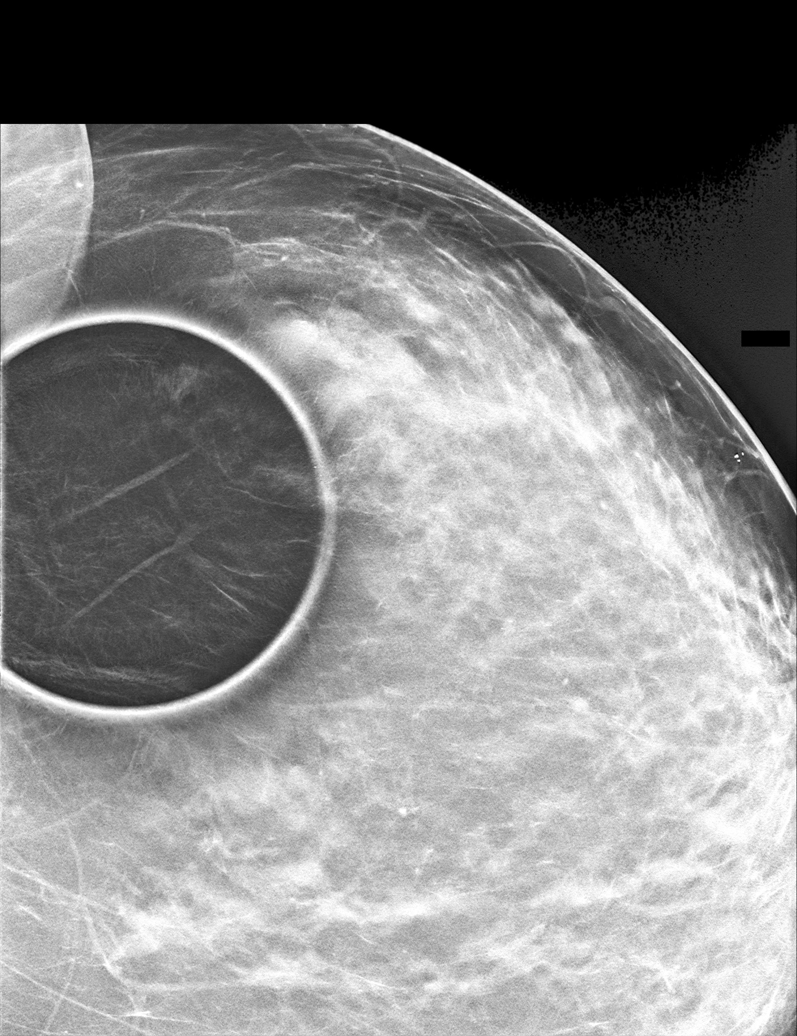

[L CC synth-2D (2 of 2)]
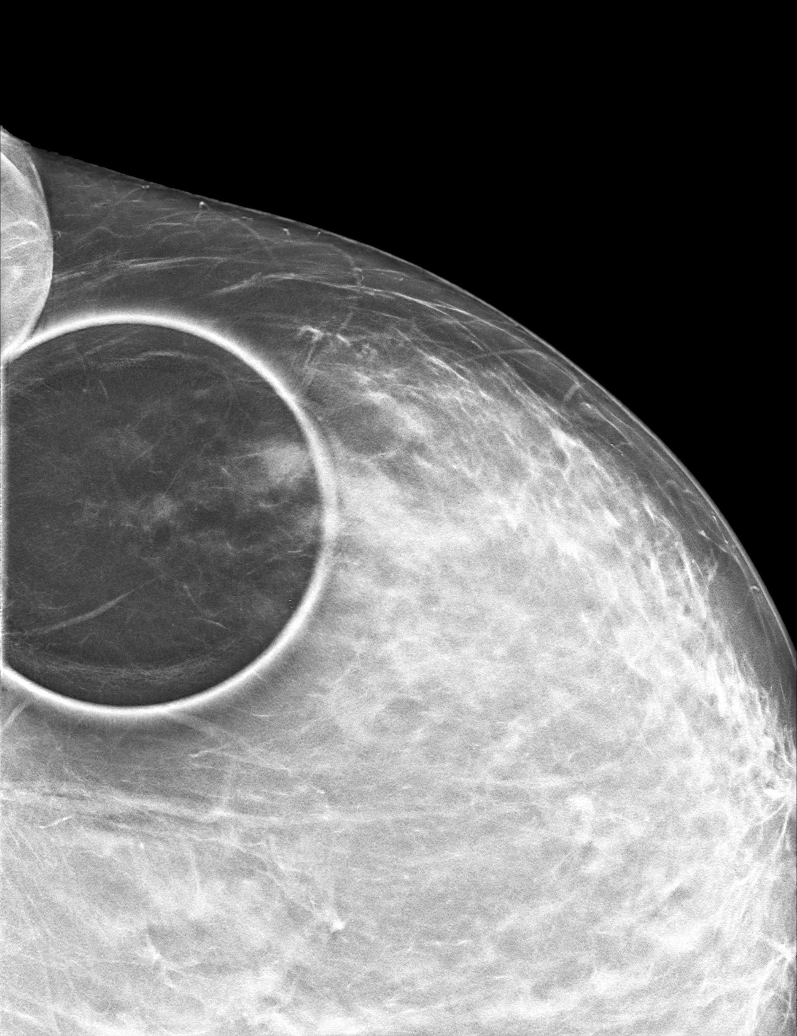

[L CC tomo (1 of 2) · tomo slice 31/62.0]
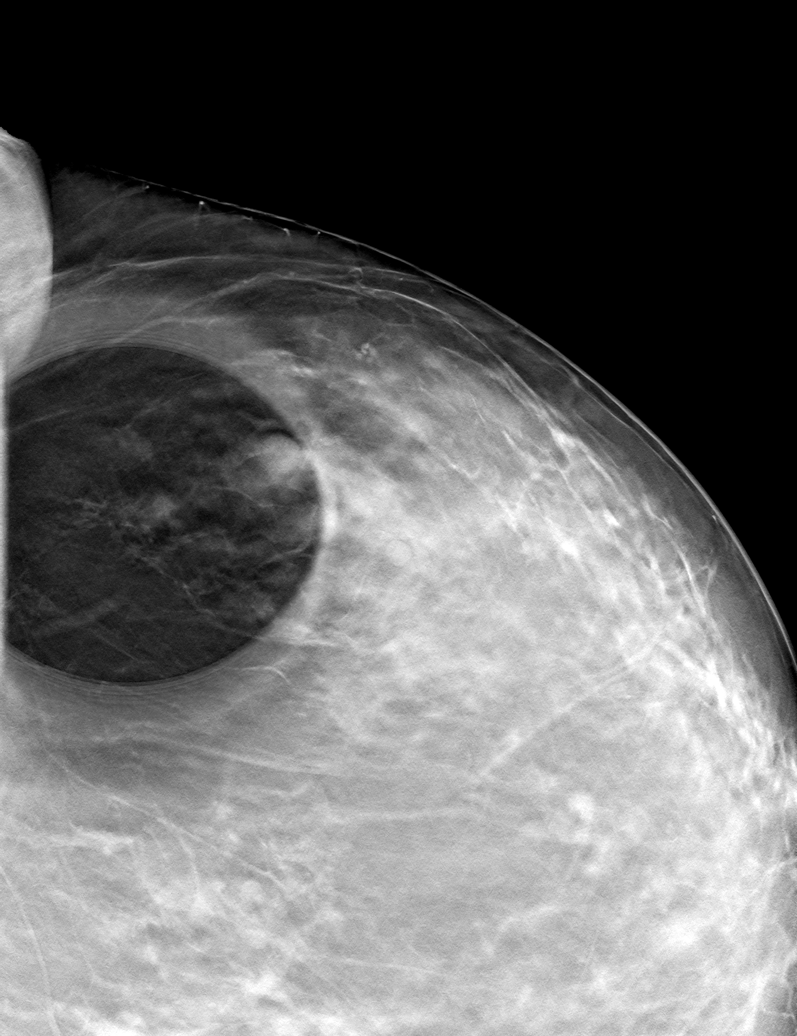

[L CC tomo (2 of 2) · tomo slice 33/64.0]
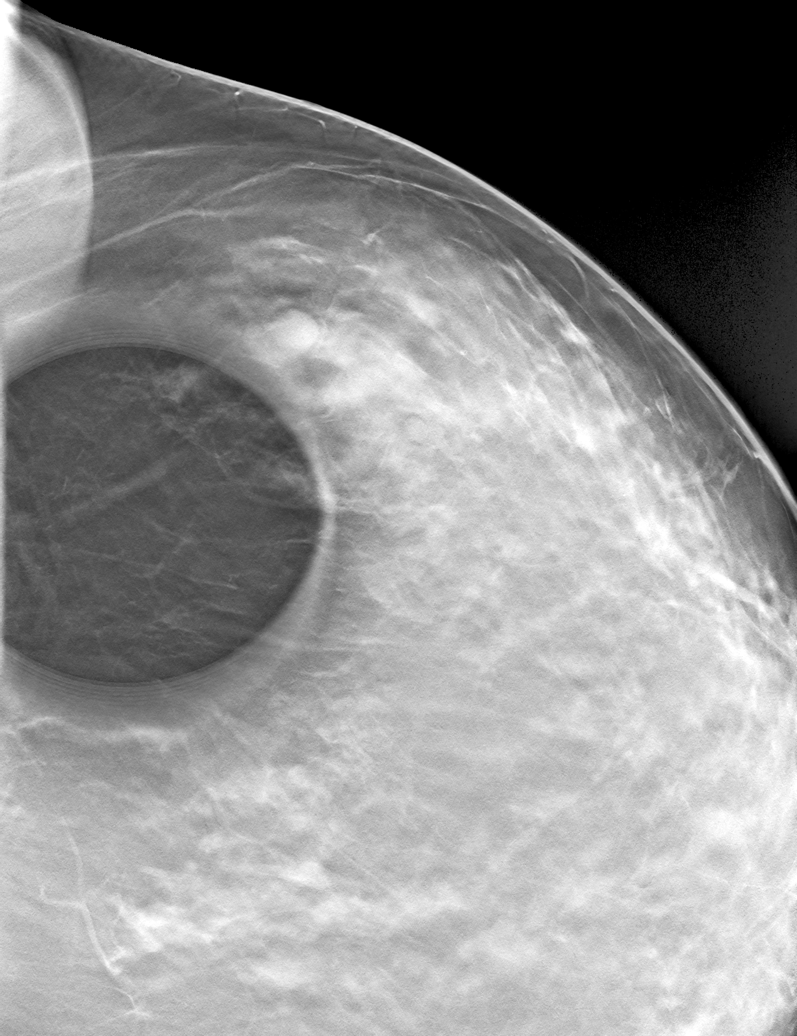

[L ML tomo · tomo slice 47/94.0]
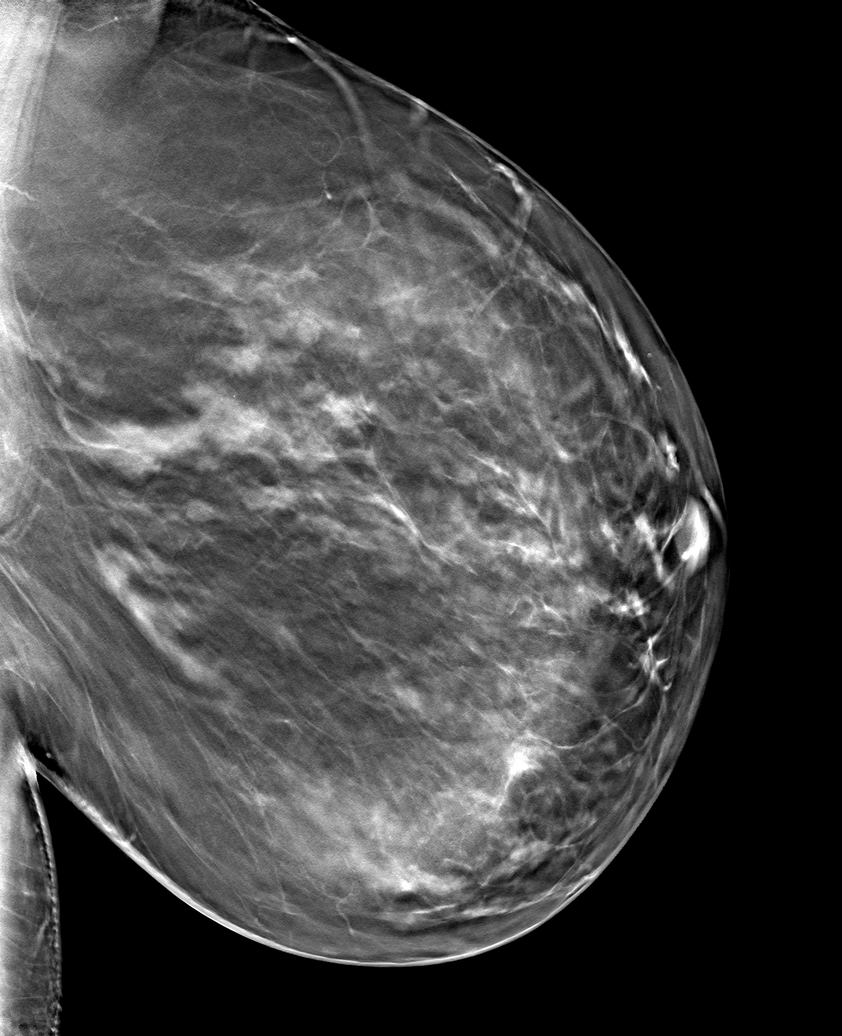

[6 of 18 positions shown; findings below may reference images not displayed]

ACR Breast Density Category b: There are scattered areas of
fibroglandular density.
FINDINGS: On focal spot compression view with tomography of the outer left
breast, the possible mass in question on the screening mammogram is
less apparent, and there is dispersion of fibroglandular breast
tissue. In the same region of the outer left breast are at least 2
subcentimeter circumscribed masses.

Mammographic images were processed with CAD.

Targeted ultrasound is performed, showing normal fibroglandular
tissue with 2 simple cyst in the [DATE] position 12 cm from the
nipple. No solid mass or abnormal shadowing is seen in the outer
left breast.
IMPRESSION: No evidence of malignancy. Two small benign cysts in the [DATE]
position of the left breast.

RECOMMENDATION:
Screening mammogram in one year.(Code:[id number])

I have discussed the findings and recommendations with the patient.
Results were also provided in writing at the conclusion of the
visit. If applicable, a reminder letter will be sent to the patient
regarding the next appointment.

BI-RADS CATEGORY  2: Benign.

## 2018-01-24 ENCOUNTER — Ambulatory Visit (HOSPITAL_BASED_OUTPATIENT_CLINIC_OR_DEPARTMENT_OTHER): Payer: 59

## 2019-12-14 ENCOUNTER — Other Ambulatory Visit: Payer: Self-pay | Admitting: Obstetrics & Gynecology

## 2019-12-14 DIAGNOSIS — Z1231 Encounter for screening mammogram for malignant neoplasm of breast: Secondary | ICD-10-CM

## 2019-12-16 ENCOUNTER — Other Ambulatory Visit: Payer: Self-pay

## 2019-12-16 ENCOUNTER — Ambulatory Visit
Admission: RE | Admit: 2019-12-16 | Discharge: 2019-12-16 | Disposition: A | Discharge status: Home / Self Care | Payer: BC Managed Care – PPO | Source: Ambulatory Visit | Attending: Obstetrics & Gynecology | Admitting: Obstetrics & Gynecology

## 2019-12-16 DIAGNOSIS — Z1231 Encounter for screening mammogram for malignant neoplasm of breast: Secondary | ICD-10-CM

## 2019-12-16 IMAGING — MG DIGITAL SCREENING BILAT W/ TOMO W/ CAD
6 of 12 series · 6 of 36 positions shown · non-contrast
Comparison: Previous exam(s).

CLINICAL DATA: Screening.

EXAM:
DIGITAL SCREENING BILATERAL MAMMOGRAM WITH TOMO AND CAD

[L MLO synth-2D (1 of 2)]
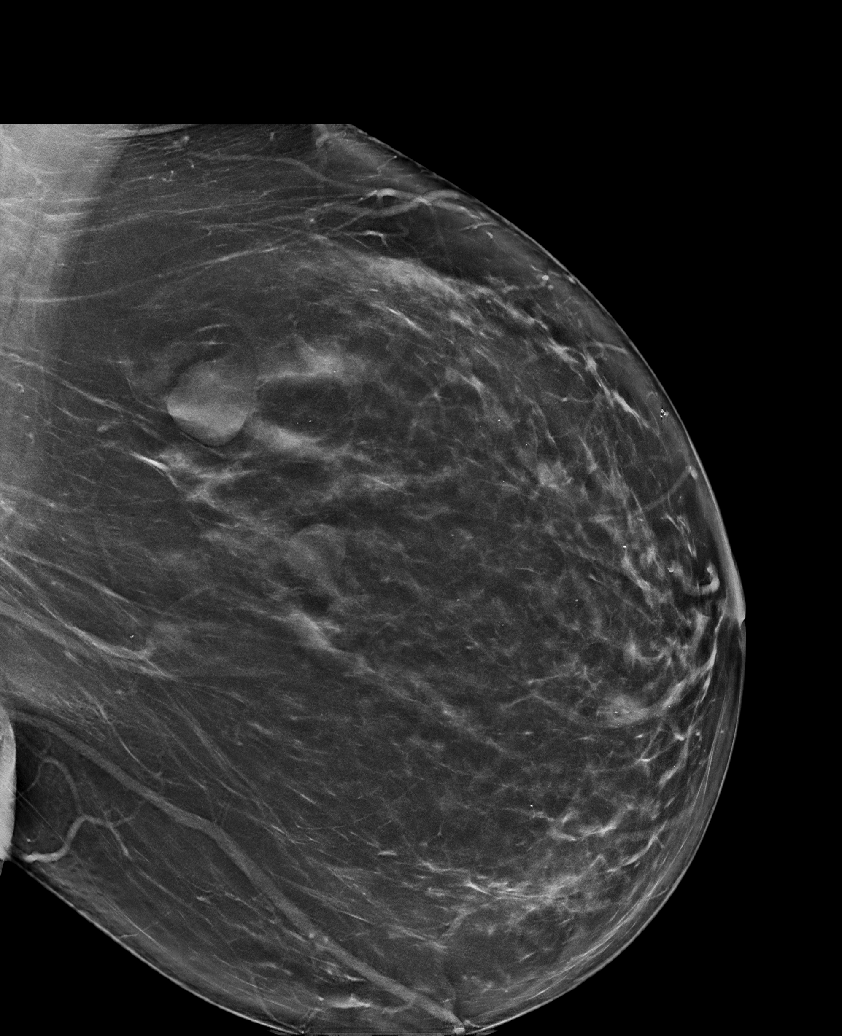

[L CC synth-2D]
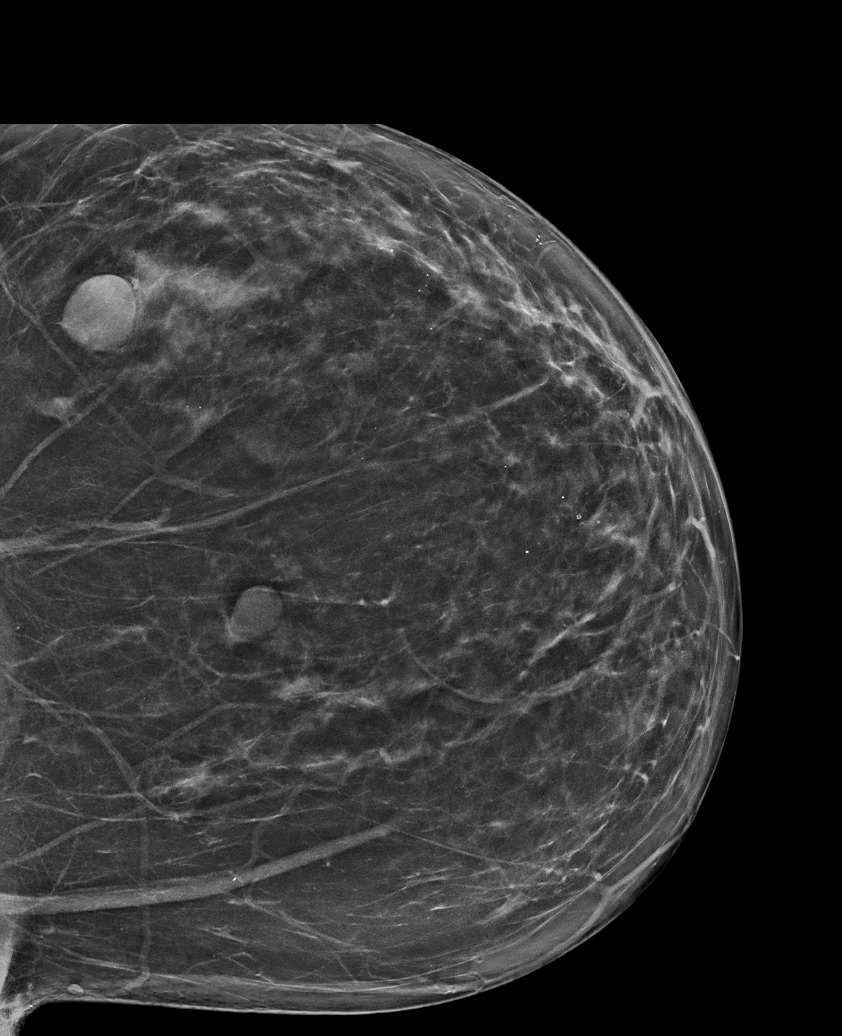

[R CC synth-2D]
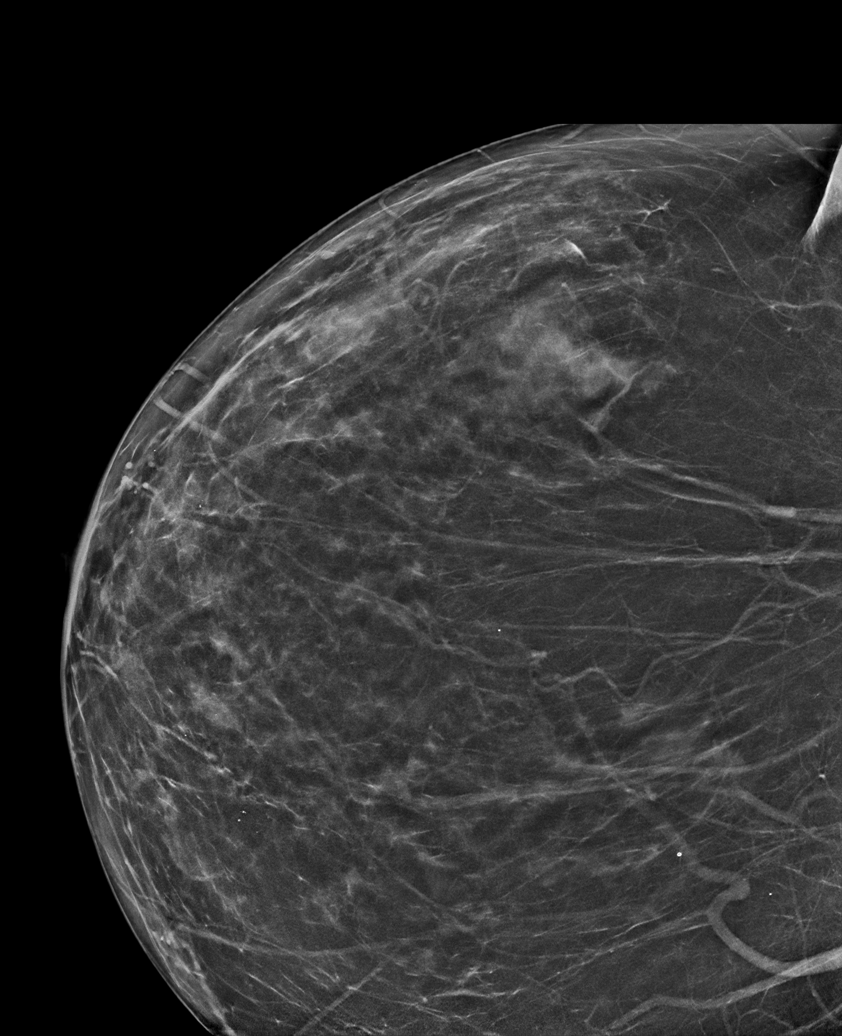

[L MLO synth-2D (2 of 2)]
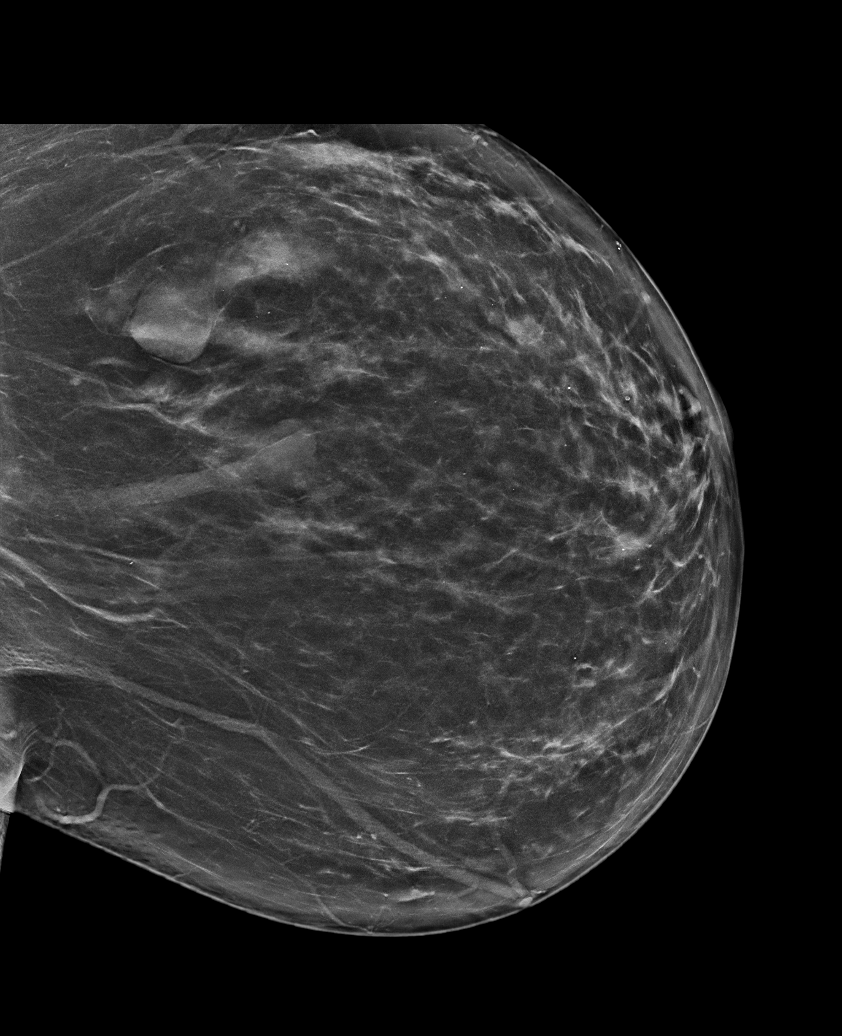

[R MLO synth-2D]
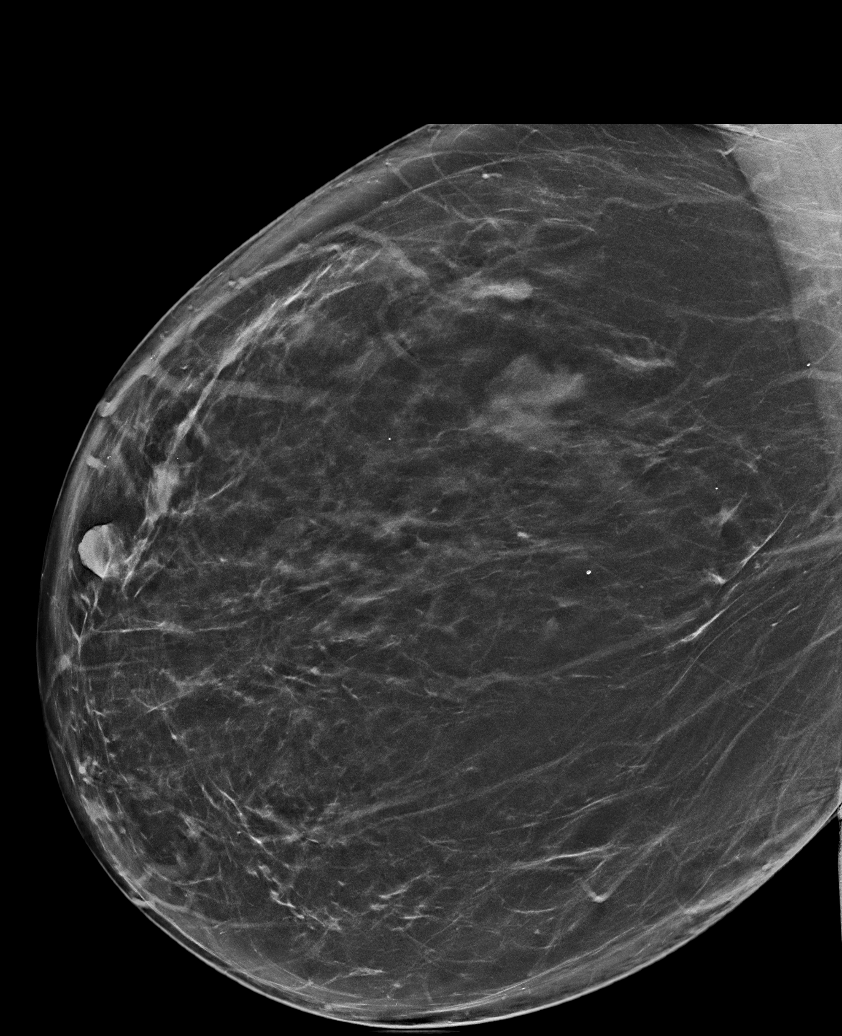

[R CV synth-2D]
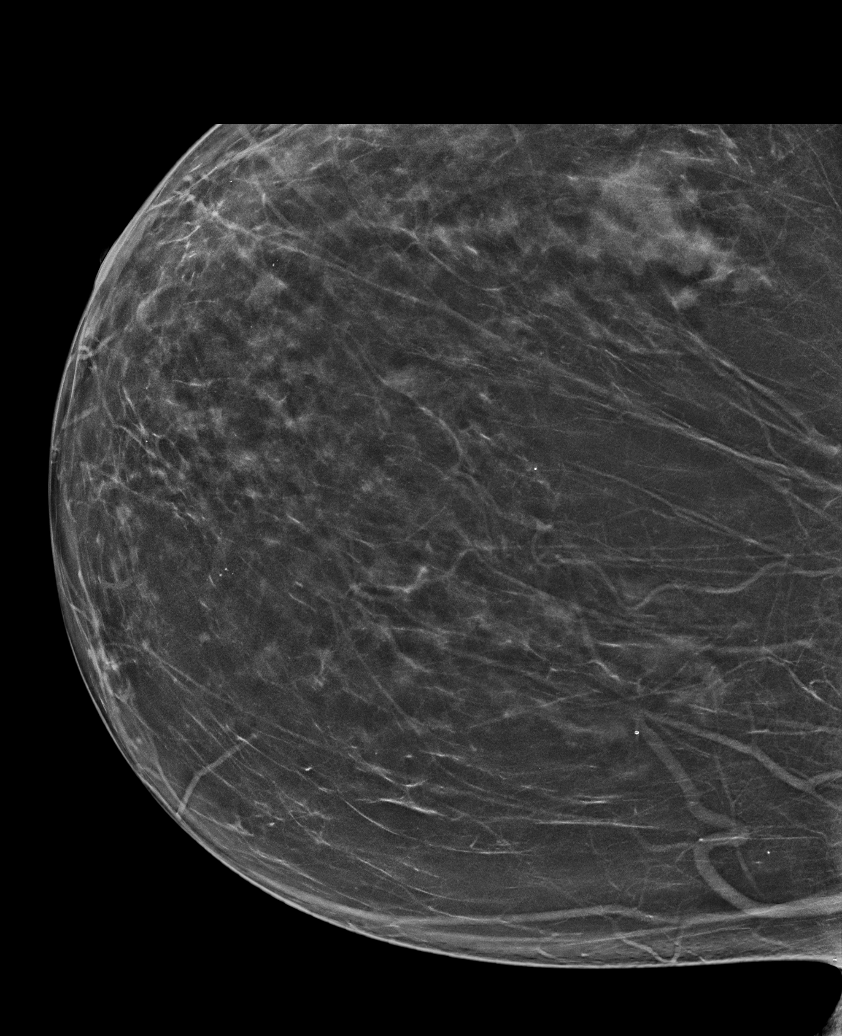

[6 of 36 positions shown; findings below may reference images not displayed]

ACR Breast Density Category b: There are scattered areas of
fibroglandular density.
FINDINGS: In the left breast possible masses warrant further evaluation. In
the right breast, no findings suspicious for malignancy.

Images were processed with CAD.
IMPRESSION: Further evaluation is suggested for possible masses in the left
breast.

RECOMMENDATION:
Diagnostic mammogram and possibly ultrasound of the left breast.
(Code:[id number])

The patient will be contacted regarding the findings, and additional
imaging will be scheduled.

BI-RADS CATEGORY  0: Incomplete. Need additional imaging evaluation
and/or prior mammograms for comparison.

## 2019-12-21 ENCOUNTER — Other Ambulatory Visit: Payer: Self-pay | Admitting: Obstetrics & Gynecology

## 2019-12-21 DIAGNOSIS — R928 Other abnormal and inconclusive findings on diagnostic imaging of breast: Secondary | ICD-10-CM

## 2020-01-04 ENCOUNTER — Ambulatory Visit
Admission: RE | Admit: 2020-01-04 | Discharge: 2020-01-04 | Disposition: A | Discharge status: Home / Self Care | Payer: BC Managed Care – PPO | Source: Ambulatory Visit | Attending: Obstetrics & Gynecology | Admitting: Obstetrics & Gynecology

## 2020-01-04 ENCOUNTER — Other Ambulatory Visit: Payer: Self-pay

## 2020-01-04 DIAGNOSIS — R928 Other abnormal and inconclusive findings on diagnostic imaging of breast: Secondary | ICD-10-CM

## 2020-01-04 IMAGING — MG MM DIGITAL DIAGNOSTIC UNILAT*L* W/ TOMO W/ CAD
6 series · 6 of 18 positions shown · non-contrast
Comparison: Previous exam(s).

CLINICAL DATA: Patient recalled from screening for left breast
masses.

EXAM:
DIGITAL DIAGNOSTIC LEFT MAMMOGRAM WITH CAD AND TOMO
ULTRASOUND LEFT BREAST

[L CC synth-2D (1 of 2)]
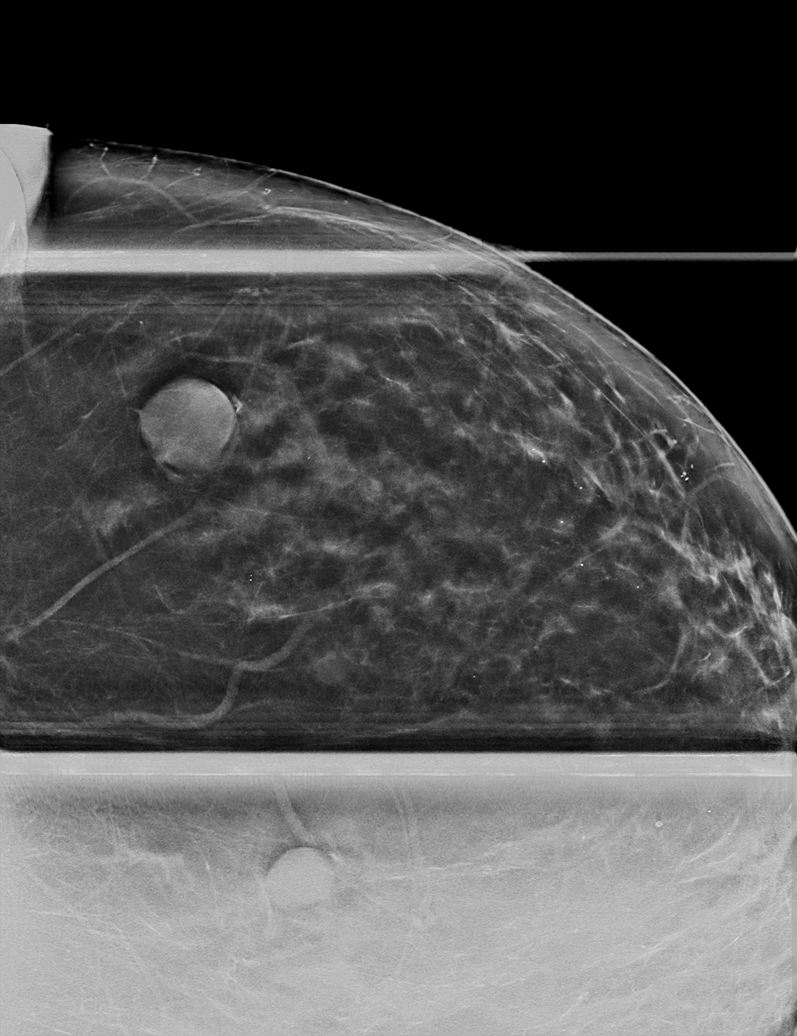

[L CC synth-2D (2 of 2)]
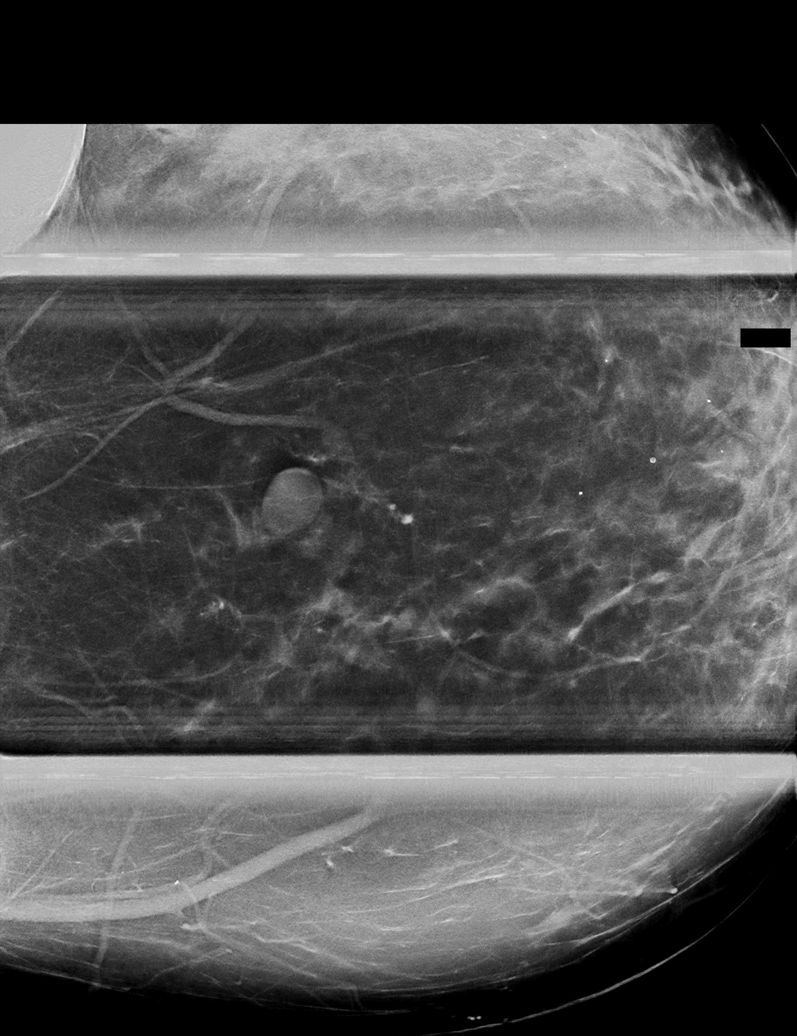

[L MLO synth-2D]
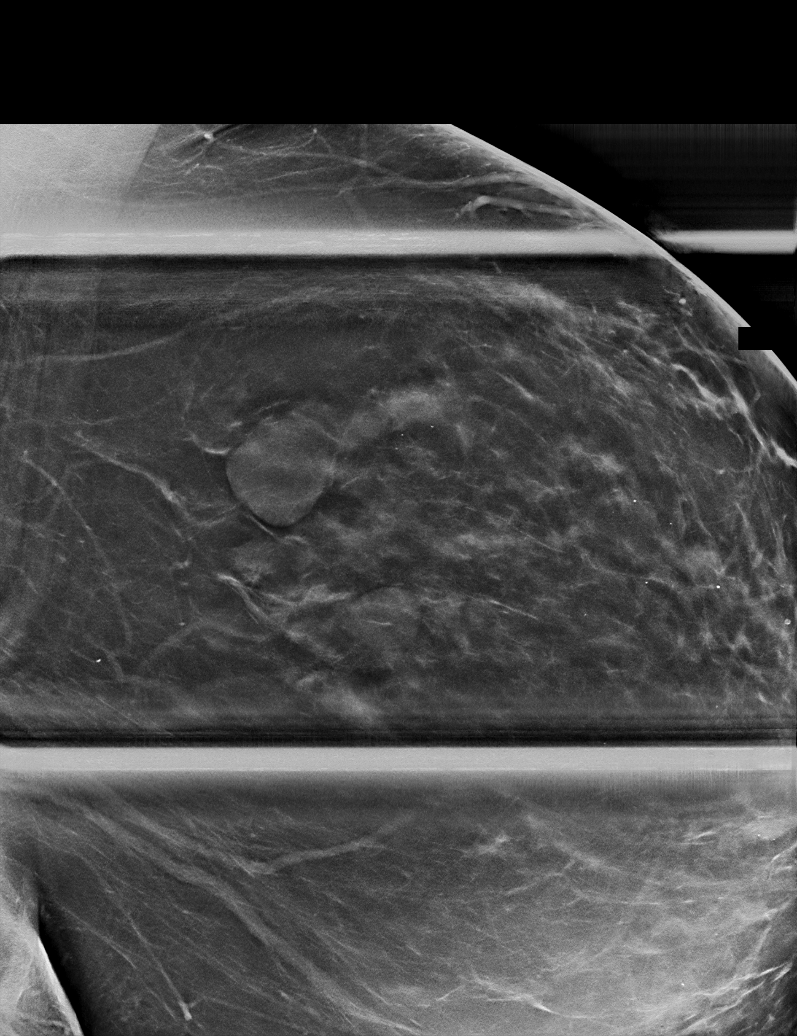

[L CC tomo (1 of 2) · tomo slice 41/80.0]
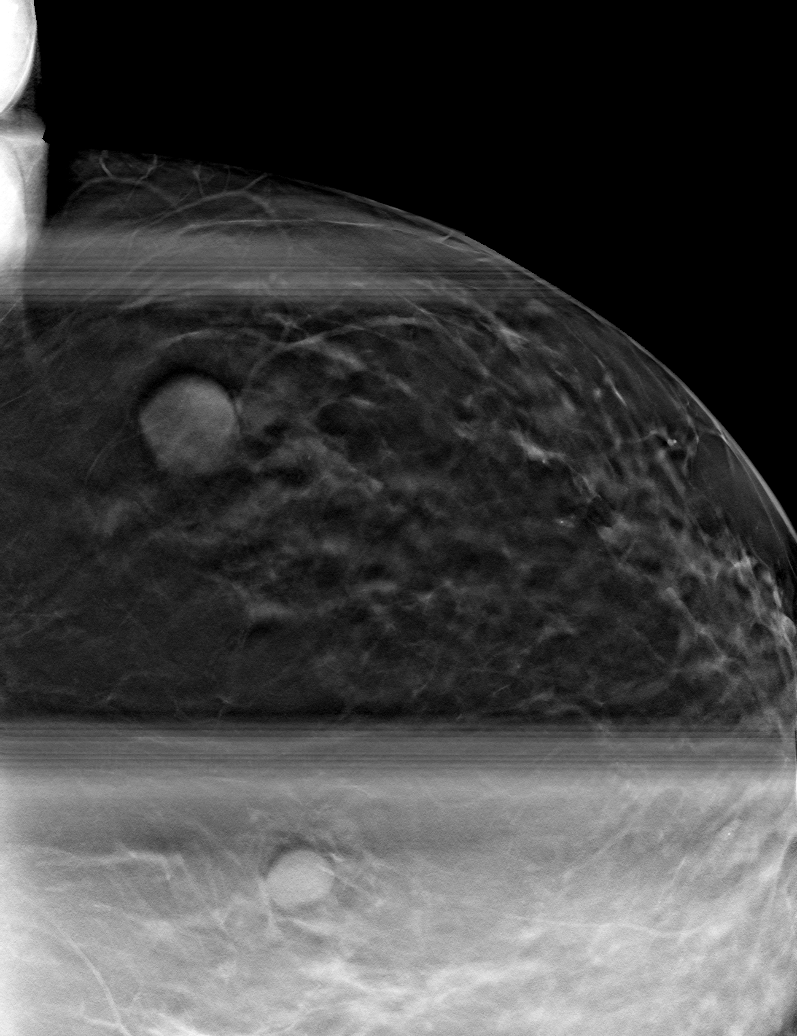

[L MLO tomo · tomo slice 48/95.0]
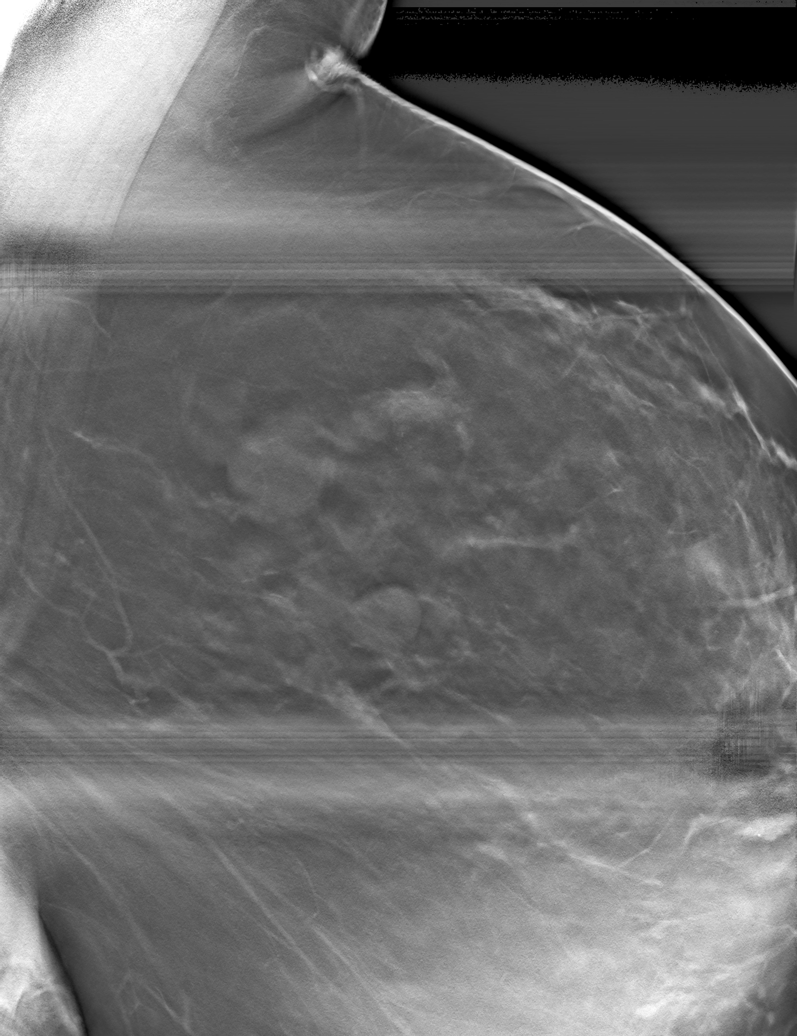

[L CC tomo (2 of 2) · tomo slice 41/82.0]
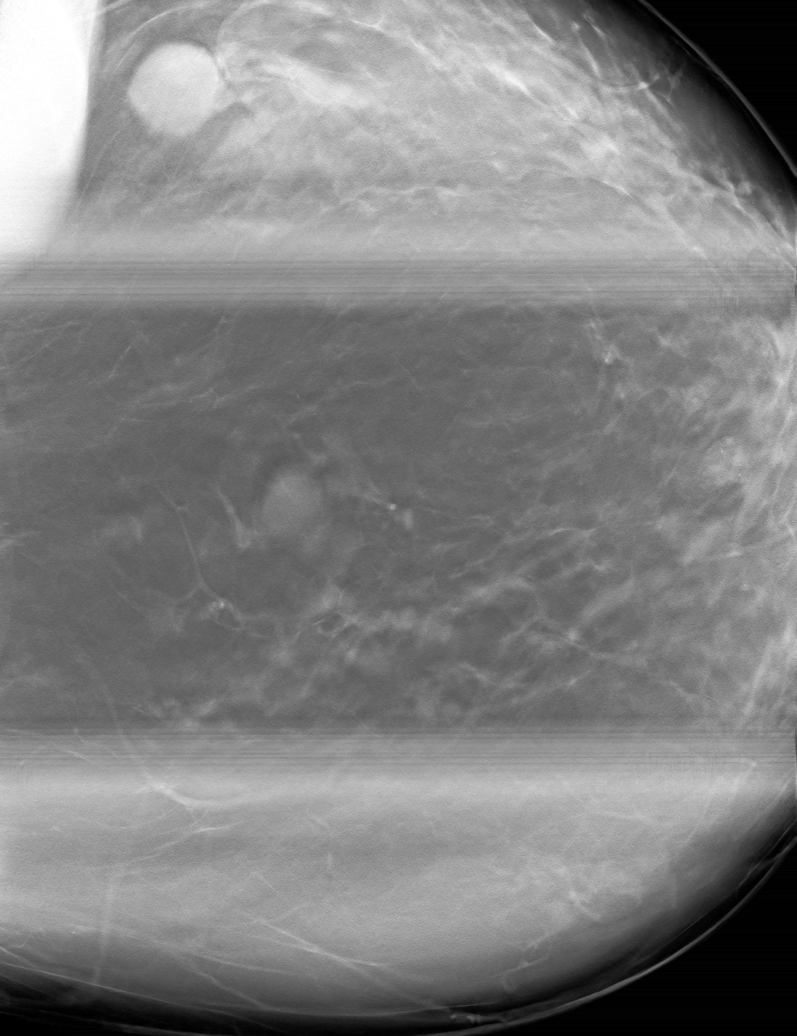

[6 of 18 positions shown; findings below may reference images not displayed]

ACR Breast Density Category b: There are scattered areas of
fibroglandular density.
FINDINGS: Within the superior central left breast there is a persistent oval
circumscribed mass.

Within the upper-outer left breast posterior depth there is a
persistent oval circumscribed mass.

Mammographic images were processed with CAD.

Targeted ultrasound is performed, showing a 1.2 x 1.4 x 0.8 cm cyst
left breast 12 o'clock position 6 cm from nipple.

Within the left breast 2 o'clock position 11 cm from nipple there is
a 2.1 x 1.8 x 1.2 cm cyst.
IMPRESSION: There are 2 simple cysts within the left breast.

No mammographic evidence for malignancy.

RECOMMENDATION:
Screening mammogram in one year.(Code:[id number])

I have discussed the findings and recommendations with the patient.
If applicable, a reminder letter will be sent to the patient
regarding the next appointment.

BI-RADS CATEGORY  2: Benign.

## 2020-01-04 IMAGING — US US BREAST*L* LIMITED INC AXILLA
1 series · 10 of 10 positions shown · non-contrast
Comparison: Previous exam(s).

CLINICAL DATA: Patient recalled from screening for left breast
masses.

EXAM:
DIGITAL DIAGNOSTIC LEFT MAMMOGRAM WITH CAD AND TOMO
ULTRASOUND LEFT BREAST

[Series 1: us breast*left* limited inc axilla · 0.08mm/px · 10 of 10 slices shown]
[im 1/10]
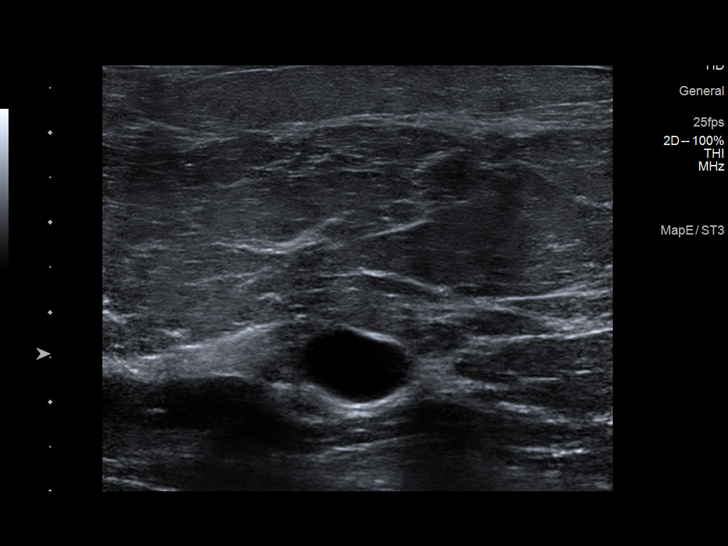
[im 2/10]
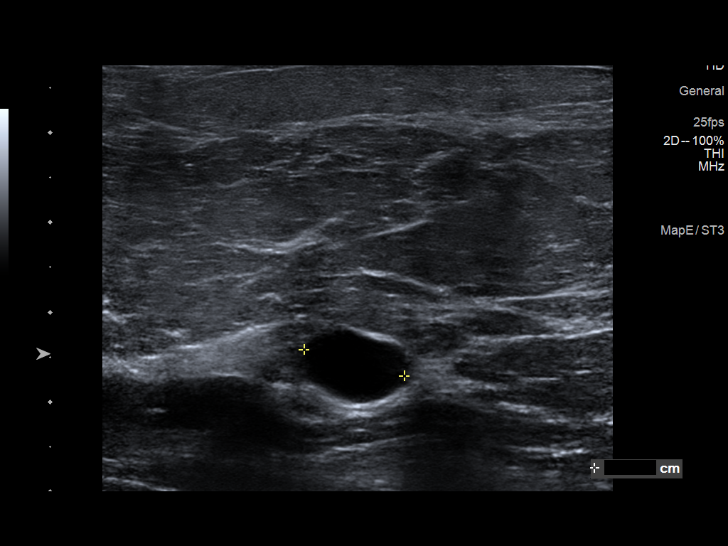
[im 3/10]
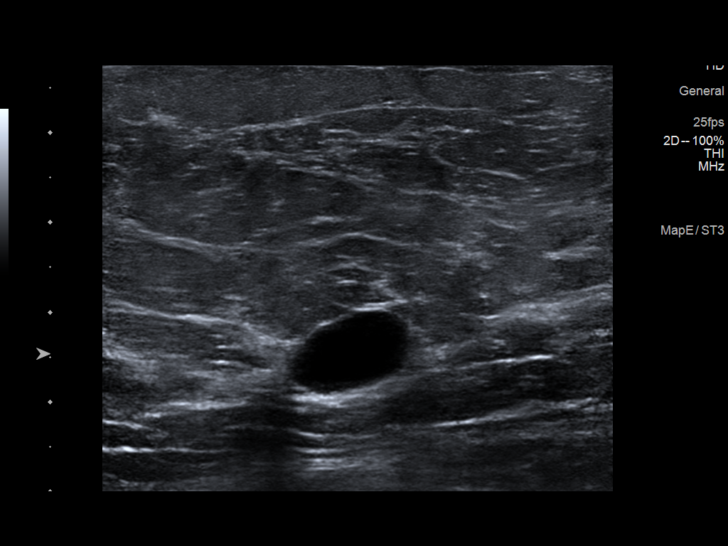
[im 4/10]
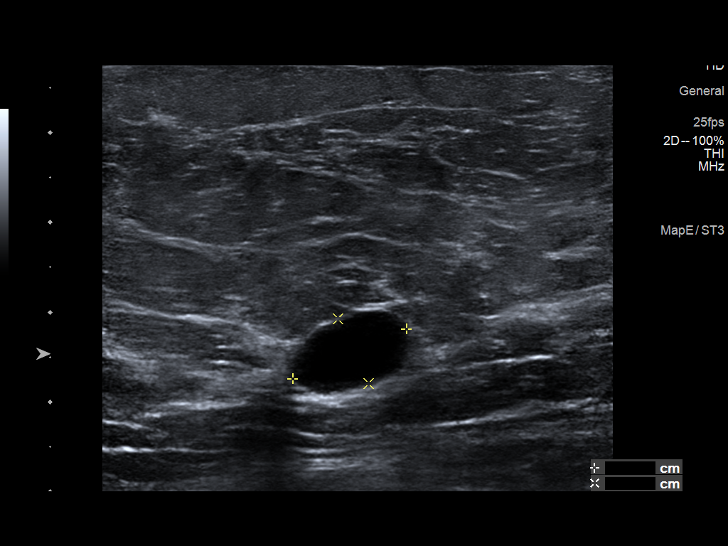
[im 5/10]
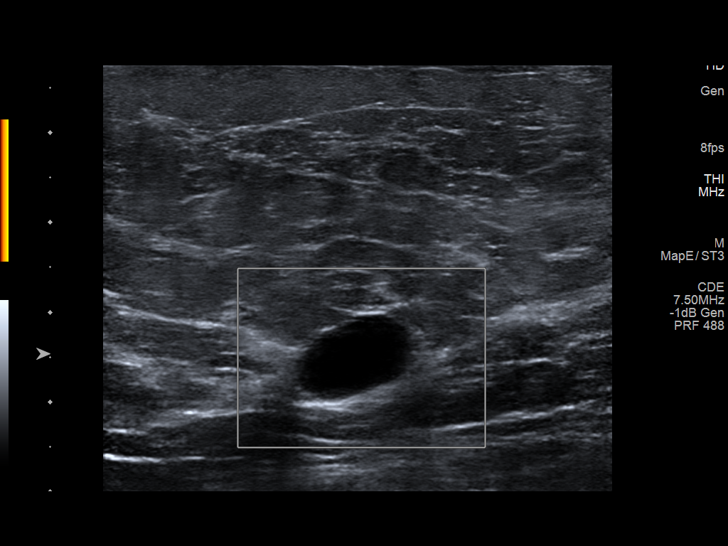
[im 6/10]
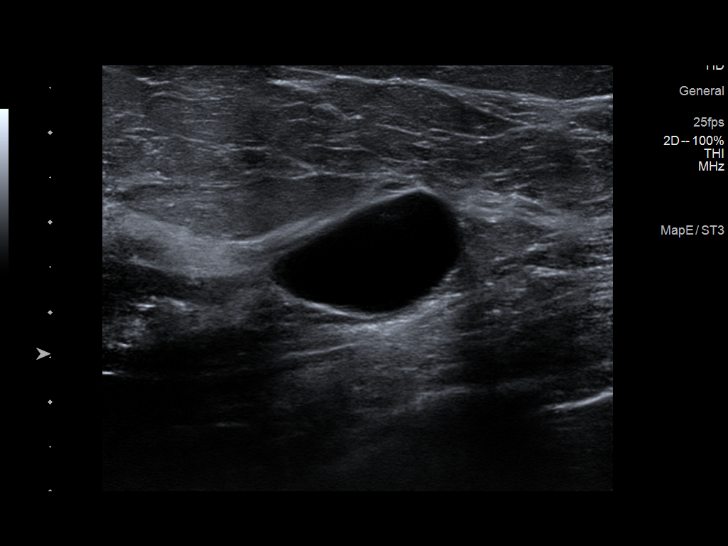
[im 7/10]
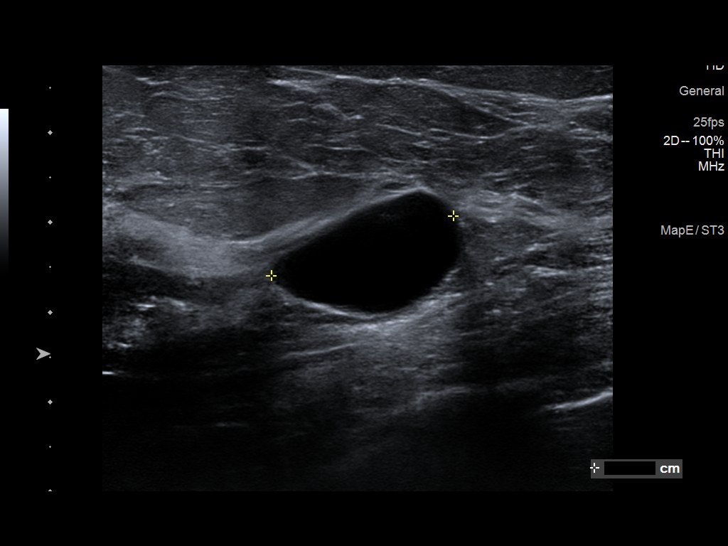
[im 8/10]
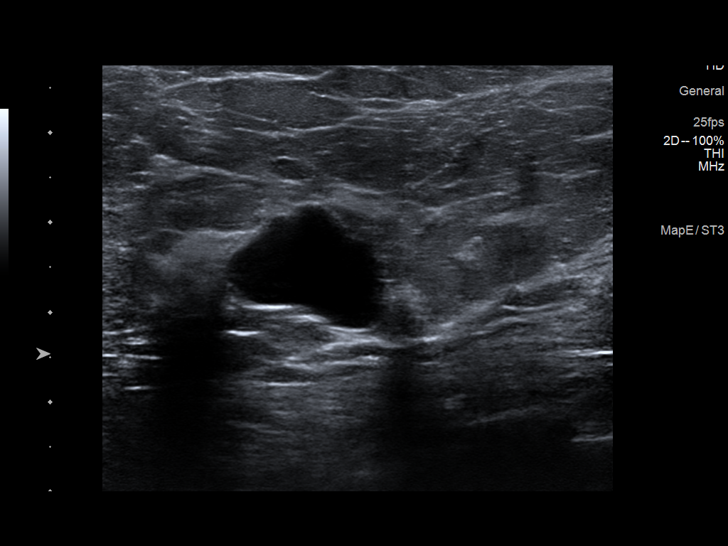
[im 9/10]
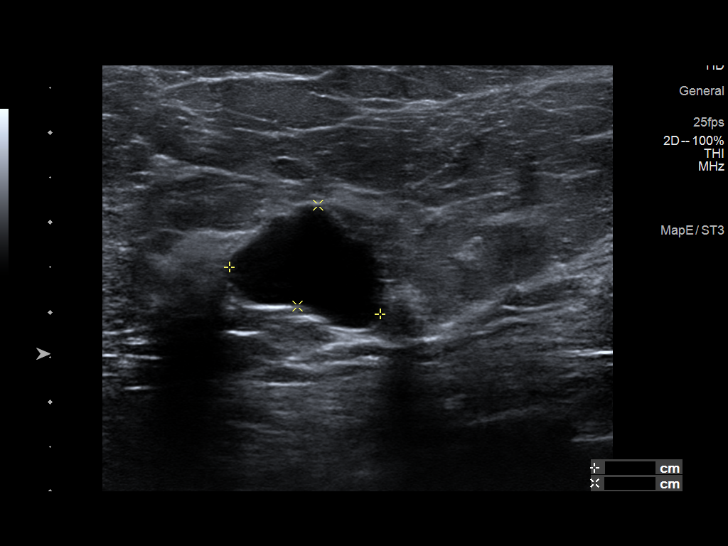
[im 10/10]
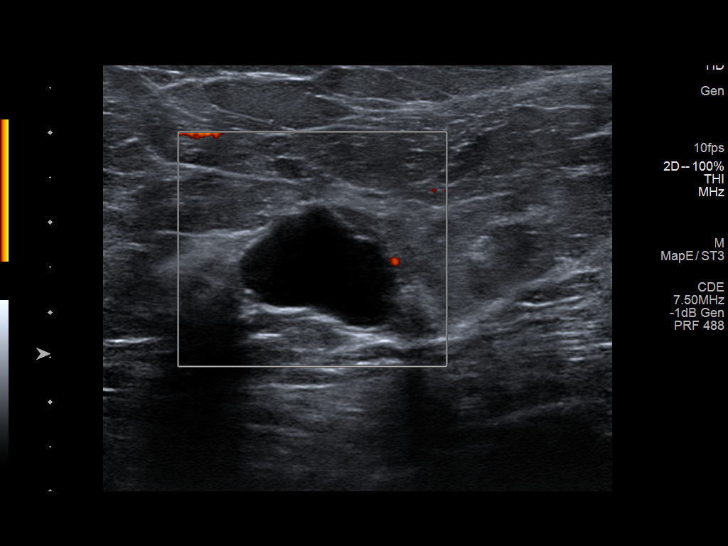

[10 of 10 positions shown; findings below may reference images not displayed]

ACR Breast Density Category b: There are scattered areas of
fibroglandular density.
FINDINGS: Within the superior central left breast there is a persistent oval
circumscribed mass.

Within the upper-outer left breast posterior depth there is a
persistent oval circumscribed mass.

Mammographic images were processed with CAD.

Targeted ultrasound is performed, showing a 1.2 x 1.4 x 0.8 cm cyst
left breast 12 o'clock position 6 cm from nipple.

Within the left breast 2 o'clock position 11 cm from nipple there is
a 2.1 x 1.8 x 1.2 cm cyst.
IMPRESSION: There are 2 simple cysts within the left breast.

No mammographic evidence for malignancy.

RECOMMENDATION:
Screening mammogram in one year.(Code:[id number])

I have discussed the findings and recommendations with the patient.
If applicable, a reminder letter will be sent to the patient
regarding the next appointment.

BI-RADS CATEGORY  2: Benign.

## 2020-03-01 HISTORY — PX: HYSTEROSCOPY WITH D & C: SHX1775

## 2020-10-14 DIAGNOSIS — Z8616 Personal history of COVID-19: Secondary | ICD-10-CM

## 2020-10-14 HISTORY — DX: Personal history of COVID-19: Z86.16

## 2021-08-11 ENCOUNTER — Emergency Department (HOSPITAL_BASED_OUTPATIENT_CLINIC_OR_DEPARTMENT_OTHER)
Admission: EM | Admit: 2021-08-11 | Discharge: 2021-08-11 | Disposition: A | Discharge status: Home / Self Care | Payer: BC Managed Care – PPO | Attending: Emergency Medicine | Admitting: Emergency Medicine

## 2021-08-11 ENCOUNTER — Other Ambulatory Visit: Payer: Self-pay

## 2021-08-11 ENCOUNTER — Emergency Department (HOSPITAL_BASED_OUTPATIENT_CLINIC_OR_DEPARTMENT_OTHER): Payer: BC Managed Care – PPO

## 2021-08-11 ENCOUNTER — Encounter (HOSPITAL_BASED_OUTPATIENT_CLINIC_OR_DEPARTMENT_OTHER): Payer: Self-pay

## 2021-08-11 DIAGNOSIS — R7989 Other specified abnormal findings of blood chemistry: Secondary | ICD-10-CM | POA: Diagnosis not present

## 2021-08-11 DIAGNOSIS — D72829 Elevated white blood cell count, unspecified: Secondary | ICD-10-CM | POA: Insufficient documentation

## 2021-08-11 DIAGNOSIS — N132 Hydronephrosis with renal and ureteral calculous obstruction: Secondary | ICD-10-CM | POA: Insufficient documentation

## 2021-08-11 DIAGNOSIS — R000 Tachycardia, unspecified: Secondary | ICD-10-CM | POA: Insufficient documentation

## 2021-08-11 DIAGNOSIS — R109 Unspecified abdominal pain: Secondary | ICD-10-CM | POA: Diagnosis present

## 2021-08-11 HISTORY — DX: Attention-deficit hyperactivity disorder, unspecified type: F90.9

## 2021-08-11 HISTORY — DX: Calculus of kidney: N20.0

## 2021-08-11 LAB — CBC WITH DIFFERENTIAL/PLATELET
Abs Immature Granulocytes: 0.06 10*3/uL (ref 0.00–0.07)
Basophils Absolute: 0 10*3/uL (ref 0.0–0.1)
Basophils Relative: 0 %
Eosinophils Absolute: 0 10*3/uL (ref 0.0–0.5)
Eosinophils Relative: 0 %
HCT: 42.4 % (ref 36.0–46.0)
Hemoglobin: 14.6 g/dL (ref 12.0–15.0)
Immature Granulocytes: 0 %
Lymphocytes Relative: 2 %
Lymphs Abs: 0.3 10*3/uL — ABNORMAL LOW (ref 0.7–4.0)
MCH: 30.8 pg (ref 26.0–34.0)
MCHC: 34.4 g/dL (ref 30.0–36.0)
MCV: 89.5 fL (ref 80.0–100.0)
Monocytes Absolute: 0.2 10*3/uL (ref 0.1–1.0)
Monocytes Relative: 2 %
Neutro Abs: 12.9 10*3/uL — ABNORMAL HIGH (ref 1.7–7.7)
Neutrophils Relative %: 96 %
Platelets: 225 10*3/uL (ref 150–400)
RBC: 4.74 MIL/uL (ref 3.87–5.11)
RDW: 13.1 % (ref 11.5–15.5)
WBC: 13.5 10*3/uL — ABNORMAL HIGH (ref 4.0–10.5)
nRBC: 0 % (ref 0.0–0.2)

## 2021-08-11 LAB — COMPREHENSIVE METABOLIC PANEL
ALT: 17 U/L (ref 0–44)
AST: 28 U/L (ref 15–41)
Albumin: 3.8 g/dL (ref 3.5–5.0)
Alkaline Phosphatase: 65 U/L (ref 38–126)
Anion gap: 11 (ref 5–15)
BUN: 18 mg/dL (ref 6–20)
CO2: 23 mmol/L (ref 22–32)
Calcium: 9.1 mg/dL (ref 8.9–10.3)
Chloride: 101 mmol/L (ref 98–111)
Creatinine, Ser: 1.23 mg/dL — ABNORMAL HIGH (ref 0.44–1.00)
GFR, Estimated: 54 mL/min — ABNORMAL LOW (ref >60)
Glucose, Bld: 118 mg/dL — ABNORMAL HIGH (ref 70–99)
Potassium: 3.5 mmol/L (ref 3.5–5.1)
Sodium: 135 mmol/L (ref 135–145)
Total Bilirubin: 1 mg/dL (ref 0.3–1.2)
Total Protein: 6.9 g/dL (ref 6.5–8.1)

## 2021-08-11 LAB — URINALYSIS, MICROSCOPIC (REFLEX)

## 2021-08-11 LAB — URINALYSIS, ROUTINE W REFLEX MICROSCOPIC
Bilirubin Urine: NEGATIVE
Glucose, UA: NEGATIVE mg/dL
Ketones, ur: NEGATIVE mg/dL
Leukocytes,Ua: NEGATIVE
Nitrite: NEGATIVE
Protein, ur: 30 mg/dL — AB
Specific Gravity, Urine: 1.02 (ref 1.005–1.030)
pH: 6 (ref 5.0–8.0)

## 2021-08-11 LAB — PREGNANCY, URINE: Preg Test, Ur: NEGATIVE

## 2021-08-11 IMAGING — CT CT RENAL STONE PROTOCOL
2 of 4 series · 16 of 46 positions shown, 18 images · non-contrast
Comparison: March 16, 2015

CLINICAL DATA: Left lower quadrant pain.

EXAM:
CT ABDOMEN AND PELVIS WITHOUT CONTRAST
TECHNIQUE: Multidetector CT imaging of the abdomen and pelvis was performed
following the standard protocol without IV contrast.

[Series 2: axial st · axial · 0.98mm/px · z∈[-477,-37]mm · 13 of 98 slices shown, 15 images]
[im 5/98  soft-tissue]
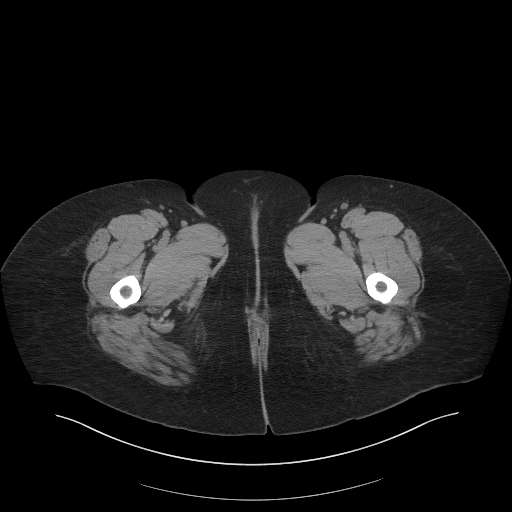
[im 5/98  bone]
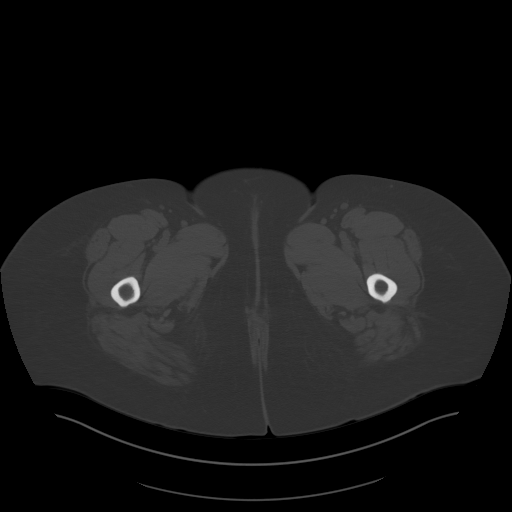
[im 13/98  soft-tissue]
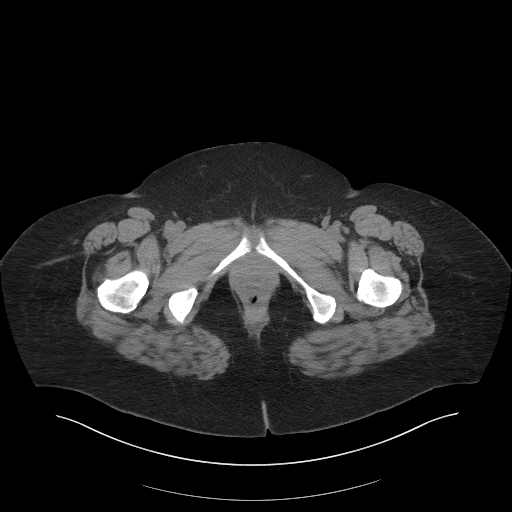
[im 22/98  soft-tissue]
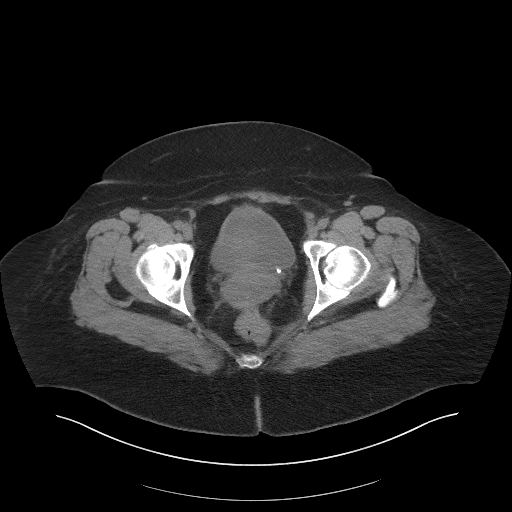
[im 26/98  soft-tissue]
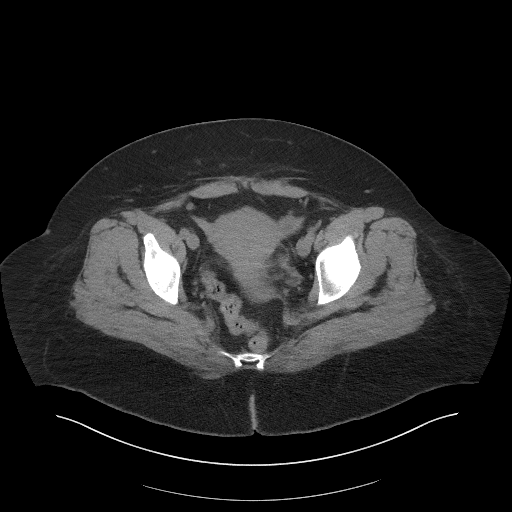
[im 34/98  soft-tissue]
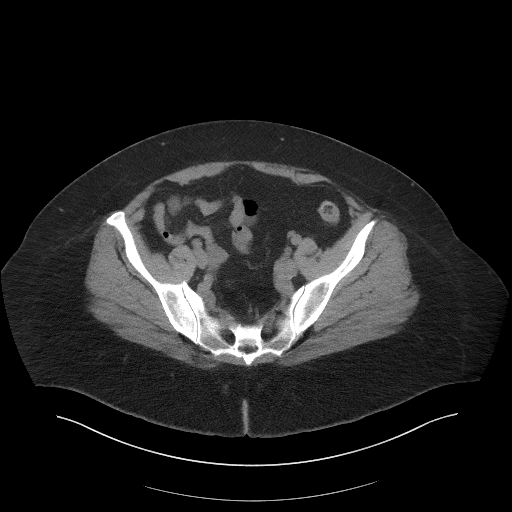
[im 43/98  soft-tissue]
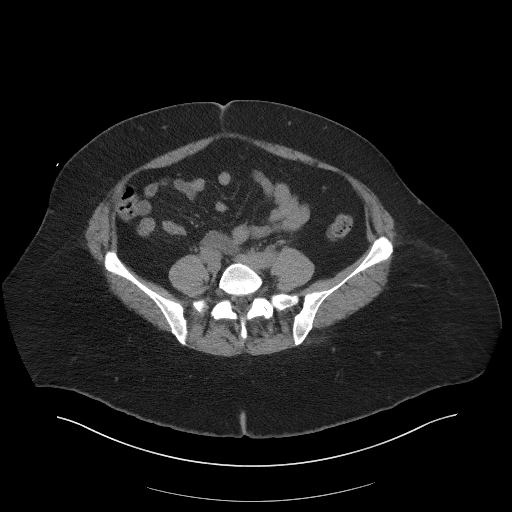
[im 51/98  soft-tissue]
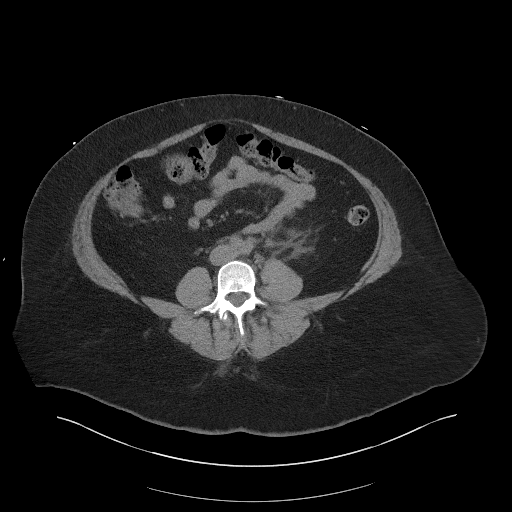
[im 55/98  soft-tissue]
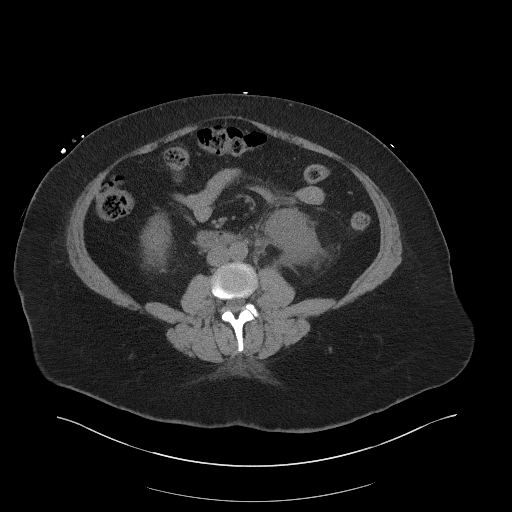
[im 64/98  soft-tissue]
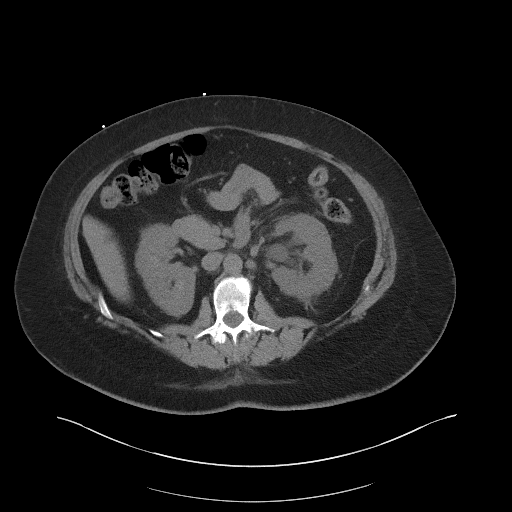
[im 64/98  bone]
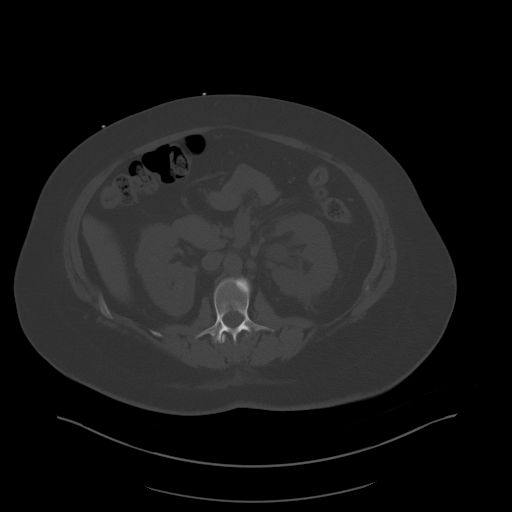
[im 72/98  soft-tissue]
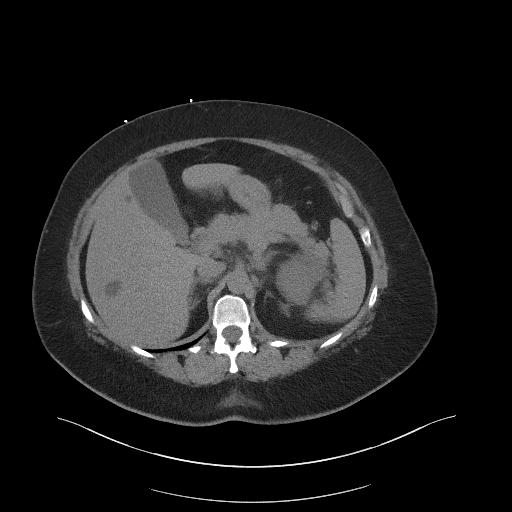
[im 76/98  soft-tissue]
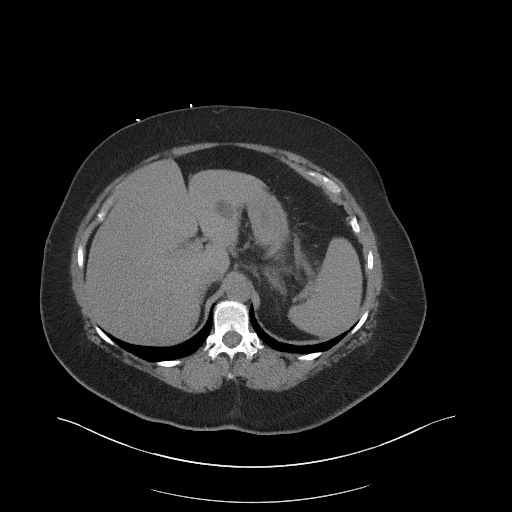
[im 85/98  soft-tissue]
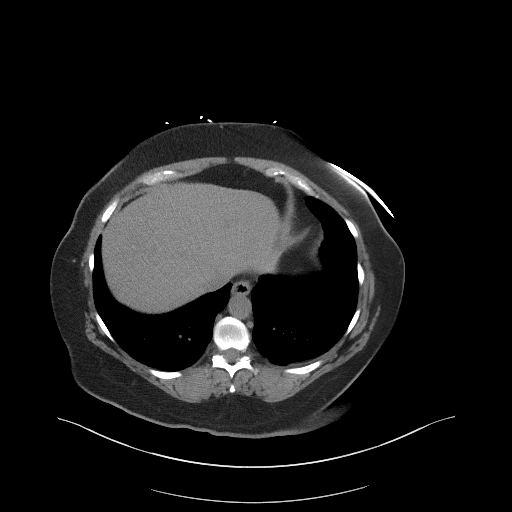
[im 93/98  soft-tissue]
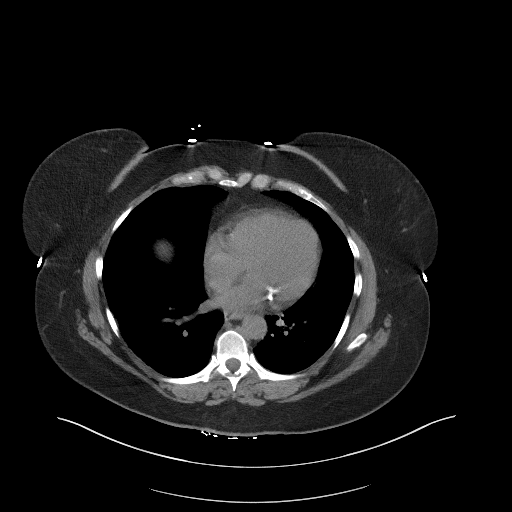

[Series 5: coronal st · coronal · 0.91mm/px · 3 of 115 slices shown]
[im 39/115  soft-tissue]
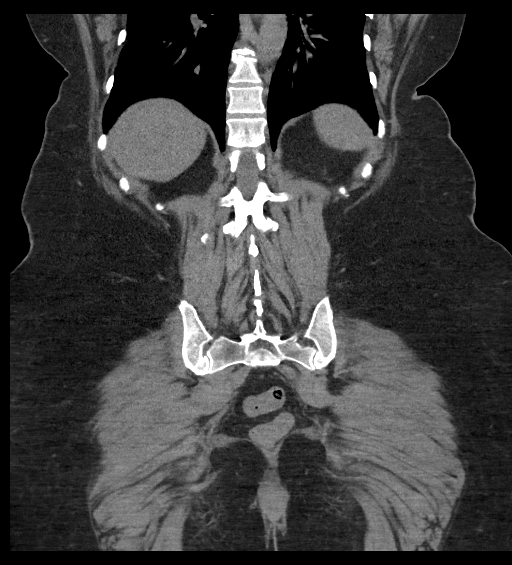
[im 51/115  soft-tissue]
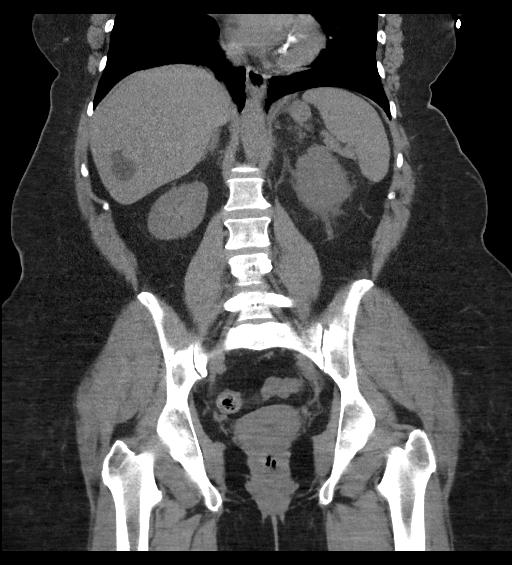
[im 64/115  soft-tissue]
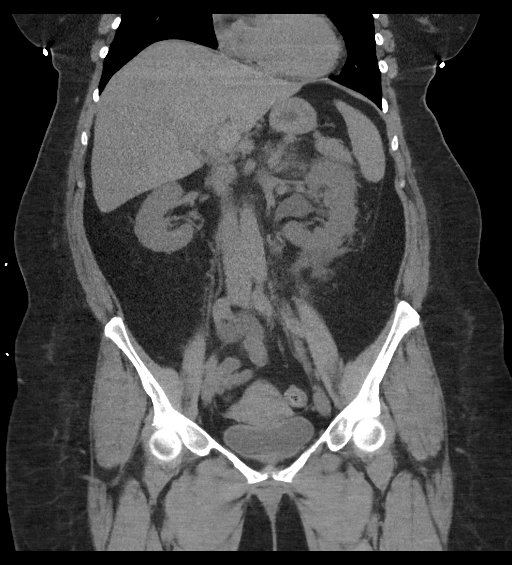

[16 of 46 positions shown; findings below may reference images not displayed]

FINDINGS: Lower chest: No acute abnormality.

Hepatobiliary: Stable cystic appearing areas are seen within the
right lobe of the liver. No gallstones, gallbladder wall thickening,
or biliary dilatation.

Pancreas: Unremarkable. No pancreatic ductal dilatation or
surrounding inflammatory changes.

Spleen: Normal in size without focal abnormality.

Adrenals/Urinary Tract: Adrenal glands are unremarkable. Kidneys are
normal in size, without focal lesions. A 4 mm obstructing renal
stone is seen at the left UVJ with moderate severity left-sided
hydronephrosis, hydroureter and perinephric inflammatory fat
stranding. 2 mm nonobstructing renal stones are seen within both
kidneys. Bladder is unremarkable.

Stomach/Bowel: Stomach is within normal limits. The appendix is
surgically absent. No evidence of bowel wall thickening, distention,
or inflammatory changes.

Vascular/Lymphatic: No significant vascular findings are present. No
enlarged abdominal or pelvic lymph nodes.

Reproductive: Uterus and bilateral adnexa are unremarkable.

Other: No abdominal wall hernia or abnormality. No abdominopelvic
ascites.

Musculoskeletal: No acute or significant osseous findings.
IMPRESSION: 1. 4 mm obstructing renal stone at the left UVJ.
2. Bilateral 2 mm nonobstructing renal calculi.

## 2021-08-11 MED ORDER — ONDANSETRON HCL 4 MG/2ML IJ SOLN
4.0000 mg | Freq: Once | INTRAMUSCULAR | Status: AC
  Administered 2021-08-11: 4 mg via INTRAVENOUS
  Filled 2021-08-11: qty 2

## 2021-08-11 MED ORDER — HYDROMORPHONE HCL 1 MG/ML IJ SOLN
1.0000 mg | Freq: Once | INTRAMUSCULAR | Status: AC
  Administered 2021-08-11: 1 mg via INTRAVENOUS
  Filled 2021-08-11: qty 1

## 2021-08-11 MED ORDER — KETOROLAC TROMETHAMINE 30 MG/ML IJ SOLN
30.0000 mg | Freq: Once | INTRAMUSCULAR | Status: AC
  Administered 2021-08-11: 30 mg via INTRAVENOUS
  Filled 2021-08-11: qty 1

## 2021-08-11 MED ORDER — SODIUM CHLORIDE 0.9 % IV BOLUS
1000.0000 mL | Freq: Once | INTRAVENOUS | Status: AC
  Administered 2021-08-11: 1000 mL via INTRAVENOUS

## 2021-08-11 MED ORDER — OXYCODONE-ACETAMINOPHEN 5-325 MG PO TABS: 1.0000 | ORAL_TABLET | Freq: Four times a day (QID) | ORAL | 0 refills | Status: AC | PRN

## 2021-08-11 MED ORDER — ONDANSETRON 4 MG PO TBDP: 4.0000 mg | ORAL_TABLET | Freq: Three times a day (TID) | ORAL | 0 refills | Status: AC | PRN

## 2021-08-11 MED ORDER — TAMSULOSIN HCL 0.4 MG PO CAPS: 0.4000 mg | ORAL_CAPSULE | Freq: Every day | ORAL | 0 refills | Status: AC

## 2021-08-11 NOTE — Discharge Instructions (Addendum)
Please pick up medications and take as prescribed.  Drink plenty of fluids to stay hydrated and to help flush out the stone.  Follow up with [facility] for further evaluation of your kidney stone.   Return to the ED for any new/worsening symptoms including worsening pain, urinary retention, fevers > 100.4, or any other new/concerning symptoms.

## 2021-08-11 NOTE — ED Triage Notes (Signed)
Pt endorses LLQ abdominal and L sided flank pain since last night. Hx of kidney stones per pt, with similar feeling. Pt endorses N/V multiple times since last night.

## 2021-08-11 NOTE — ED Provider Notes (Signed)
[facility] EMERGENCY DEPARTMENT Provider Note   CSN: [id number] Arrival date & time: 08/11/21  1528     History Chief Complaint  Patient presents with   Abdominal Pain   Flank Pain    Cathy Johns is a 50 y.o. female who presents to the ED today with complaint of gradual onset, intermittent, L flank pain that began yesterday. Pt endorsed yesterday that her pain seemed similar to previous kidney stone 7-8 years ago. She took 1,000 mg Tylenol with relief of her pain however woke up this morning with worsening pain. She also had 2 episodes of NBNB emesis. She has been unable to get comfortable since this AM. Husband reports she felt very warm however he did not check her temperature. She is afebrile in the ED today. She does mention she recently was treated for a UTI about 1 month ago and required 2 rounds of oral antibiotics - last abx was sulfa. She had been feeling well until yesterday. She has no other complaints at this time.   The history is provided by the patient, the spouse and medical records.      Past Medical History:  Diagnosis Date   ADHD    Kidney stones     There are no problems to display for this patient.   Past Surgical History:  Procedure Laterality Date   APPENDECTOMY       OB History   No obstetric history on file.     Family History  Problem Relation Age of Onset   Cervical cancer Mother 64   Breast cancer Maternal Grandmother 71    Social History   Tobacco Use   Smoking status: Never    Home Medications Prior to Admission medications   Medication Sig Start Date End Date Taking? Authorizing Provider  amphetamine-dextroamphetamine (ADDERALL) 20 MG tablet Take 25 mg by mouth daily.   Yes [provider]  ondansetron (ZOFRAN ODT) 4 MG disintegrating tablet Take 1 tablet (4 mg total) by mouth every 8 (eight) hours as needed for nausea or vomiting. 08/11/21  Yes Hassen Bruun, PA-C  oxyCODONE-acetaminophen  (PERCOCET/ROXICET) 5-325 MG tablet Take 1 tablet by mouth every 6 (six) hours as needed for severe pain. 08/11/21  Yes Seraj Dunnam, PA-C  tamsulosin (FLOMAX) 0.4 MG CAPS capsule Take 1 capsule (0.4 mg total) by mouth daily for 7 days. 08/11/21 08/18/21 Yes Rayven Rettig, PA-C  zolpidem (AMBIEN) 10 MG tablet Take by mouth at bedtime as needed for sleep.   Yes [provider]  ondansetron (ZOFRAN) 4 MG tablet Take 1 tablet (4 mg total) by mouth every 8 (eight) hours as needed for nausea or vomiting. 03/16/15   Horton, Mayer Masker, MD  oxyCODONE-acetaminophen (PERCOCET/ROXICET) 5-325 MG per tablet Take 1-2 tablets by mouth every 6 (six) hours as needed for severe pain. 03/16/15   Horton, Mayer Masker, MD    Allergies    Patient has no known allergies.  Review of Systems   Review of Systems  Constitutional:  Positive for chills. Negative for fever.  Gastrointestinal:  Positive for nausea and vomiting. Negative for diarrhea.  Genitourinary:  Positive for flank pain. Negative for dysuria and frequency.  All other systems reviewed and are negative.  Physical Exam Updated Vital Signs BP 115/62   Pulse (!) 102   Temp 98.4 F (36.9 C) (Oral)   Resp 13   Ht 5\' 2"  (1.575 m)   Wt 106.6 kg   SpO2 95%   BMI 42.98 kg/m  Physical Exam Vitals and nursing note reviewed.  Constitutional:      Appearance: She is obese. She is not ill-appearing or diaphoretic.  HENT:     Head: Normocephalic and atraumatic.  Eyes:     Conjunctiva/sclera: Conjunctivae normal.  Cardiovascular:     Rate and Rhythm: Regular rhythm. Tachycardia present.  Pulmonary:     Effort: Pulmonary effort is normal.     Breath sounds: Normal breath sounds. No wheezing, rhonchi or rales.  Abdominal:     General: Bowel sounds are normal.     Palpations: Abdomen is soft.     Tenderness: There is no abdominal tenderness. There is left CVA tenderness.  Musculoskeletal:     Cervical back: Neck supple.  Skin:    General:  Skin is warm and dry.  Neurological:     Mental Status: She is alert.    ED Results / Procedures / Treatments   Labs (all labs ordered are listed, but only abnormal results are displayed) Labs Reviewed  URINALYSIS, ROUTINE W REFLEX MICROSCOPIC - Abnormal; Notable for the following components:      Result Value   APPearance HAZY (*)    Hgb urine dipstick SMALL (*)    Protein, ur 30 (*)    All other components within normal limits  COMPREHENSIVE METABOLIC PANEL - Abnormal; Notable for the following components:   Glucose, Bld 118 (*)    Creatinine, Ser 1.23 (*)    GFR, Estimated 54 (*)    All other components within normal limits  CBC WITH DIFFERENTIAL/PLATELET - Abnormal; Notable for the following components:   WBC 13.5 (*)    Neutro Abs 12.9 (*)    Lymphs Abs 0.3 (*)    All other components within normal limits  URINALYSIS, MICROSCOPIC (REFLEX) - Abnormal; Notable for the following components:   Bacteria, UA MANY (*)    All other components within normal limits  PREGNANCY, URINE    EKG None  Radiology CT Renal Stone Study  Result Date: 08/11/2021 CLINICAL DATA:  Left lower quadrant pain. EXAM: CT ABDOMEN AND PELVIS WITHOUT CONTRAST TECHNIQUE: Multidetector CT imaging of the abdomen and pelvis was performed following the standard protocol without IV contrast. COMPARISON:  March 16, 2015 FINDINGS: Lower chest: No acute abnormality. Hepatobiliary: Stable cystic appearing areas are seen within the right lobe of the liver. No gallstones, gallbladder wall thickening, or biliary dilatation. Pancreas: Unremarkable. No pancreatic ductal dilatation or surrounding inflammatory changes. Spleen: Normal in size without focal abnormality. Adrenals/Urinary Tract: Adrenal glands are unremarkable. Kidneys are normal in size, without focal lesions. A 4 mm obstructing renal stone is seen at the left UVJ with moderate severity left-sided hydronephrosis, hydroureter and perinephric inflammatory fat  stranding. 2 mm nonobstructing renal stones are seen within both kidneys. Bladder is unremarkable. Stomach/Bowel: Stomach is within normal limits. The appendix is surgically absent. No evidence of bowel wall thickening, distention, or inflammatory changes. Vascular/Lymphatic: No significant vascular findings are present. No enlarged abdominal or pelvic lymph nodes. Reproductive: Uterus and bilateral adnexa are unremarkable. Other: No abdominal wall hernia or abnormality. No abdominopelvic ascites. Musculoskeletal: No acute or significant osseous findings. IMPRESSION: 1. 4 mm obstructing renal stone at the left UVJ. 2. Bilateral 2 mm nonobstructing renal calculi. Electronically Signed   By: Aram Candela M.D.   On: 08/11/2021 19:18    Procedures Procedures   Medications Ordered in ED Medications  sodium chloride 0.9 % bolus 1,000 mL (0 mLs Intravenous Stopped 08/11/21 1741)  ondansetron (ZOFRAN) injection 4  mg (4 mg Intravenous Given 08/11/21 1633)  ketorolac (TORADOL) 30 MG/ML injection 30 mg (30 mg Intravenous Given 08/11/21 1633)  HYDROmorphone (DILAUDID) injection 1 mg (1 mg Intravenous Given 08/11/21 1832)    ED Course  I have reviewed the triage vital signs and the nursing notes.  Pertinent labs & imaging results that were available during my care of the patient were reviewed by me and considered in my medical decision making (see chart for details).    MDM Rules/Calculators/A&P                           50 year old female who presents to the ED today with complaint of left-sided flank pain that began yesterday with associated nausea and emesis today.  On arrival to the ED today patient is afebrile at 98.4.  She is mildly tachycardic at 101.  Remainder vitals are unremarkable.  She does appear slightly uncomfortable on exam.  She is noted to have left CVA tenderness palpation.  Remainder of abdomen is soft.  She denies any urinary symptoms currently however does mention she was  recently treated for UTI about 1 month ago.  She does also report history of kidney stones 7 to 8 years ago and states this feels similar.  There is no obvious left lower quadrant abdominal pain.  Differential diagnosis includes kidney stone versus UTI versus pyelonephritis versus diverticulitis.  We will plan for fluids, antiemetics, pain medication as well as lab work and reevaluation.  Patient will likely need CT scan.  CBC with mild elevation in leukocytosis 13,500 without left shift CMP with creatinine slightly elevated 1.23; receiving fluids at this time.  U/A with small hgb, 6-10 RBCs, 6-10 WBCs, many bacteria however 6-10 squamous cells as well. No nitrites or leuks.   CT:   IMPRESSION:  1. 4 mm obstructing renal stone at the left UVJ.  2. Bilateral 2 mm nonobstructing renal calculi.   Pt reevaluated after pain meds; reports improvement after toradol and dilaudid. Will discharge home at this time with percocet, flomax, and zofran with urology follow up. Pt instructed to return for any fevers, urinary retention, or worsening pain. She is in agreement with plan and stable for discharge home.   This note was prepared using Dragon voice recognition software and may include unintentional dictation errors due to the inherent limitations of voice recognition software.   Final Clinical Impression(s) / ED Diagnoses Final diagnoses:  Ureteral stone with hydronephrosis    Rx / DC Orders ED Discharge Orders          Ordered    oxyCODONE-acetaminophen (PERCOCET/ROXICET) 5-325 MG tablet  Every 6 hours PRN        08/11/21 1934    tamsulosin (FLOMAX) 0.4 MG CAPS capsule  Daily        08/11/21 1934    ondansetron (ZOFRAN ODT) 4 MG disintegrating tablet  Every 8 hours PRN        08/11/21 1934             Discharge Instructions      Please pick up medications and take as prescribed.  Drink plenty of fluids to stay hydrated and to help flush out the stone.  Follow up with [facility] for further evaluation of your kidney stone.   Return to the ED for any new/worsening symptoms including worsening pain, urinary retention, fevers > 100.4, or any other new/concerning symptoms.  Tanda Rockers, PA-C 08/11/21 Barbette Reichmann, MD 08/12/21 [phone]

## 2021-08-13 ENCOUNTER — Encounter (HOSPITAL_COMMUNITY): Payer: Self-pay | Admitting: Urology

## 2021-08-13 ENCOUNTER — Ambulatory Visit (HOSPITAL_COMMUNITY): Payer: BC Managed Care – PPO | Admitting: Anesthesiology

## 2021-08-13 ENCOUNTER — Other Ambulatory Visit: Payer: Self-pay

## 2021-08-13 ENCOUNTER — Encounter (HOSPITAL_COMMUNITY)
Admission: RE | Disposition: A | Discharge status: Home / Self Care | Payer: Self-pay | Source: Home / Self Care | Attending: Urology

## 2021-08-13 ENCOUNTER — Ambulatory Visit (HOSPITAL_COMMUNITY): Payer: BC Managed Care – PPO

## 2021-08-13 ENCOUNTER — Other Ambulatory Visit: Payer: Self-pay | Admitting: Urology

## 2021-08-13 ENCOUNTER — Observation Stay (HOSPITAL_COMMUNITY)
Admission: RE | Admit: 2021-08-13 | Discharge: 2021-08-14 | Disposition: A | Discharge status: Home / Self Care | Payer: BC Managed Care – PPO | Attending: Urology | Admitting: Urology

## 2021-08-13 DIAGNOSIS — N10 Acute tubulo-interstitial nephritis: Secondary | ICD-10-CM | POA: Diagnosis not present

## 2021-08-13 DIAGNOSIS — N201 Calculus of ureter: Principal | ICD-10-CM | POA: Diagnosis present

## 2021-08-13 DIAGNOSIS — Z23 Encounter for immunization: Secondary | ICD-10-CM | POA: Diagnosis not present

## 2021-08-13 DIAGNOSIS — Z01818 Encounter for other preprocedural examination: Secondary | ICD-10-CM

## 2021-08-13 DIAGNOSIS — Z20822 Contact with and (suspected) exposure to covid-19: Secondary | ICD-10-CM | POA: Insufficient documentation

## 2021-08-13 DIAGNOSIS — N12 Tubulo-interstitial nephritis, not specified as acute or chronic: Secondary | ICD-10-CM | POA: Diagnosis present

## 2021-08-13 DIAGNOSIS — Z87448 Personal history of other diseases of urinary system: Secondary | ICD-10-CM

## 2021-08-13 HISTORY — PX: CYSTOSCOPY WITH RETROGRADE PYELOGRAM, URETEROSCOPY AND STENT PLACEMENT: SHX5789

## 2021-08-13 HISTORY — DX: Personal history of other diseases of urinary system: Z87.448

## 2021-08-13 LAB — CBC
HCT: 40.9 % (ref 36.0–46.0)
Hemoglobin: 13.8 g/dL (ref 12.0–15.0)
MCH: 31.1 pg (ref 26.0–34.0)
MCHC: 33.7 g/dL (ref 30.0–36.0)
MCV: 92.1 fL (ref 80.0–100.0)
Platelets: 168 10*3/uL (ref 150–400)
RBC: 4.44 MIL/uL (ref 3.87–5.11)
RDW: 13.2 % (ref 11.5–15.5)
WBC: 10.5 10*3/uL (ref 4.0–10.5)
nRBC: 0 % (ref 0.0–0.2)

## 2021-08-13 LAB — PREGNANCY, URINE: Preg Test, Ur: NEGATIVE

## 2021-08-13 LAB — SARS CORONAVIRUS 2 BY RT PCR (HOSPITAL ORDER, PERFORMED IN ~~LOC~~ HOSPITAL LAB): SARS Coronavirus 2: NEGATIVE

## 2021-08-13 LAB — CREATININE, SERUM
Creatinine, Ser: 0.9 mg/dL (ref 0.44–1.00)
GFR, Estimated: >60 mL/min (ref >60)

## 2021-08-13 IMAGING — XA DG C-ARM 1-60 MIN
4 series · 5 of 5 positions shown · IV contrast (agent unspecified)
Comparison: CT renal stone 08/11/2021.

CLINICAL DATA: Cystoscopy with left retrograde pyelogram. Stent
placement.

EXAM:
DG C-ARM 1-60 MIN
CONTRAST:  Utilized
FLUOROSCOPY TIME:  Fluoroscopy Time:  12 seconds
Number of Acquired Spot Images: 5

[Series 2: uro standard · 2 of 2 slices shown (1 of 4)]
[im 1/2]
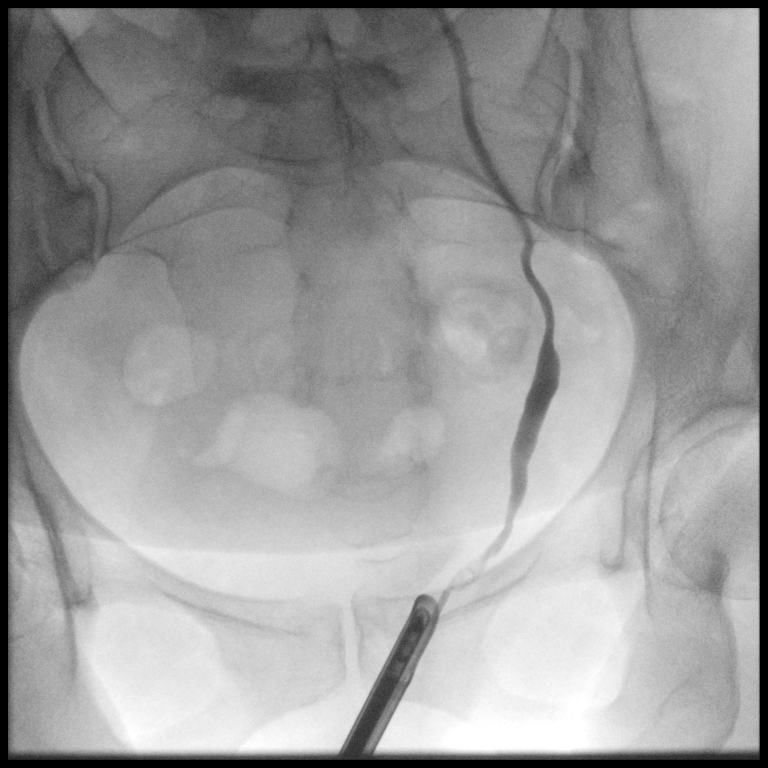
[im 2/2]
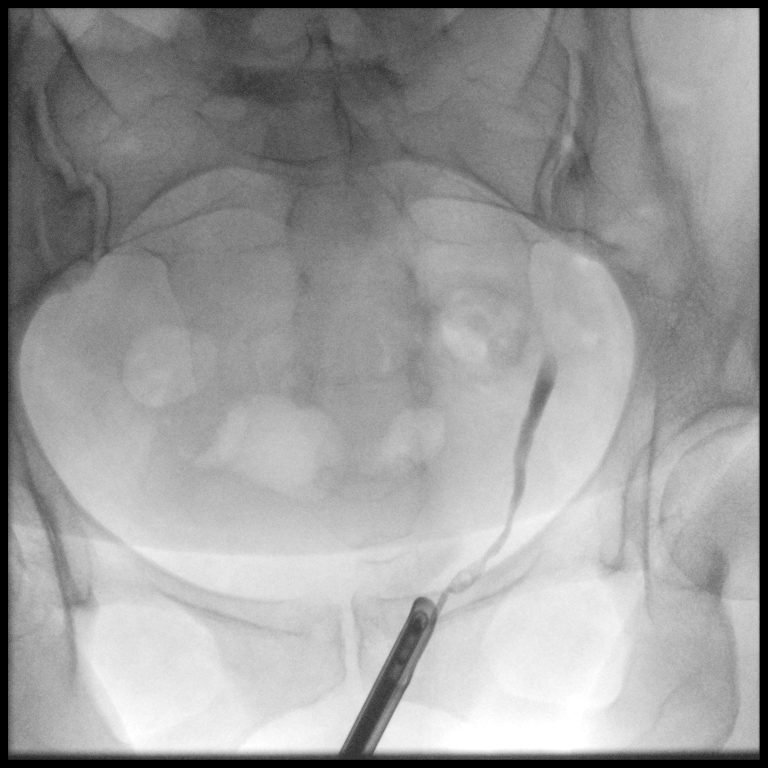

[Series 3: uro standard · 1 of 1 slices shown (2 of 4)]
[im 1/1]
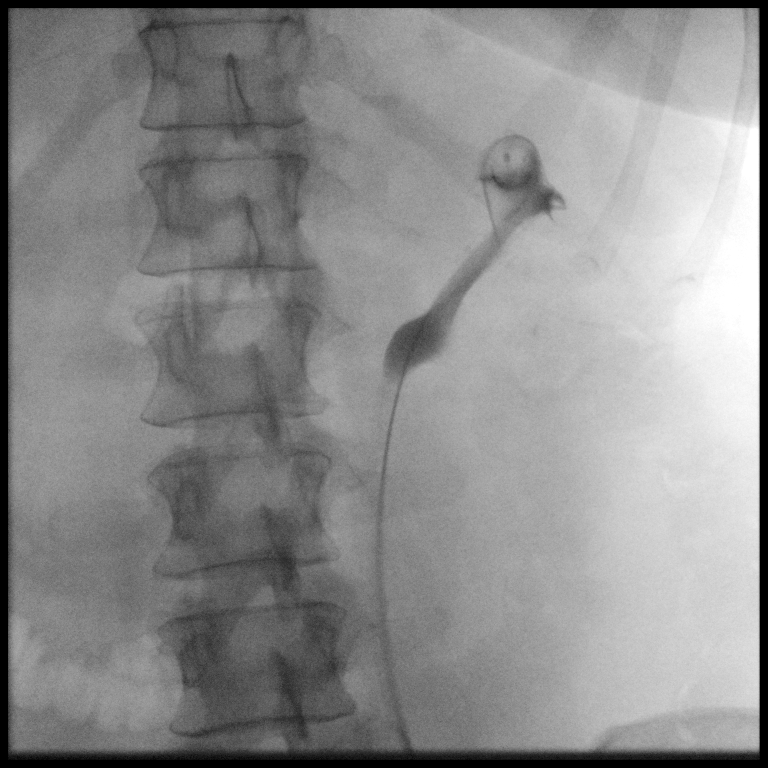

[Series 4: uro standard · 1 of 1 slices shown (3 of 4)]
[im 1/1]
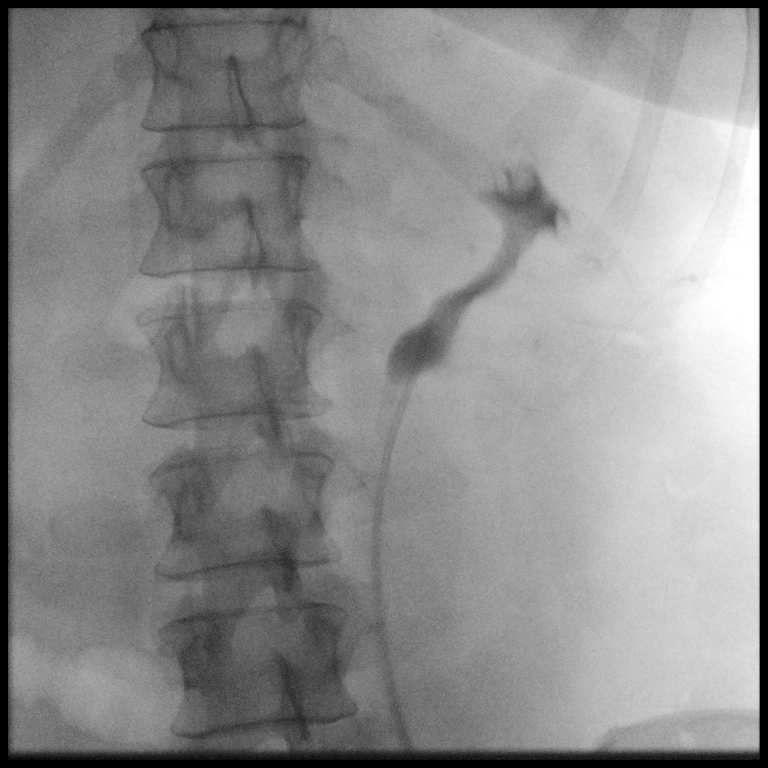

[Series 5: uro standard · 1 of 1 slices shown (4 of 4)]
[im 1/1]
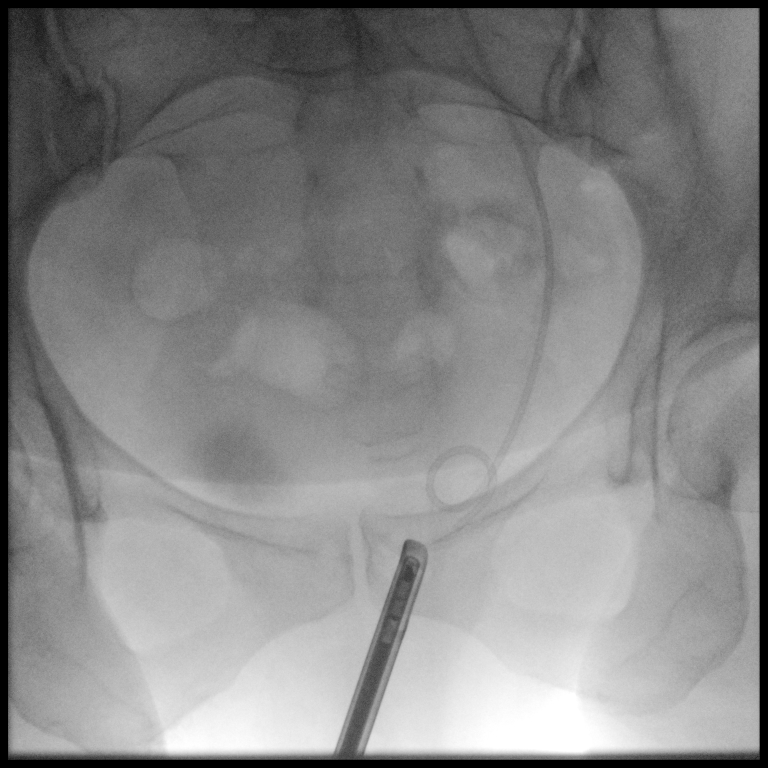

[5 of 5 positions shown; findings below may reference images not displayed]

FINDINGS: Small filling defect identified in the distal ureter compatible with
obstructing stone as seen on recent CT. Left ureteral stent placed.
IMPRESSION: Left ureteral stent placed.

## 2021-08-13 SURGERY — CYSTOURETEROSCOPY, WITH RETROGRADE PYELOGRAM AND STENT INSERTION
Anesthesia: General | Laterality: Left

## 2021-08-13 MED ORDER — ACETAMINOPHEN 10 MG/ML IV SOLN
INTRAVENOUS | Status: DC | PRN
  Administered 2021-08-13: 1000 mg via INTRAVENOUS

## 2021-08-13 MED ORDER — POTASSIUM CHLORIDE IN NACL 20-0.45 MEQ/L-% IV SOLN
INTRAVENOUS | Status: DC
  Filled 2021-08-13 (×2): qty 1000

## 2021-08-13 MED ORDER — OXYCODONE HCL 5 MG/5ML PO SOLN: 5.0000 mg | Freq: Once | ORAL | Status: DC | PRN

## 2021-08-13 MED ORDER — FENTANYL CITRATE (PF) 100 MCG/2ML IJ SOLN
INTRAMUSCULAR | Status: AC
  Filled 2021-08-13: qty 2

## 2021-08-13 MED ORDER — ONDANSETRON HCL 4 MG/2ML IJ SOLN
4.0000 mg | Freq: Once | INTRAMUSCULAR | Status: AC
  Administered 2021-08-13: 4 mg via INTRAVENOUS
  Filled 2021-08-13: qty 2

## 2021-08-13 MED ORDER — IOHEXOL 300 MG/ML  SOLN
INTRAMUSCULAR | Status: DC | PRN
  Administered 2021-08-13: 3 mL

## 2021-08-13 MED ORDER — INFLUENZA VAC SPLIT QUAD 0.5 ML IM SUSY
0.5000 mL | PREFILLED_SYRINGE | INTRAMUSCULAR | Status: AC
  Administered 2021-08-14: 0.5 mL via INTRAMUSCULAR

## 2021-08-13 MED ORDER — OXYCODONE HCL 5 MG PO TABS: 5.0000 mg | ORAL_TABLET | Freq: Once | ORAL | Status: DC | PRN

## 2021-08-13 MED ORDER — ACETAMINOPHEN 10 MG/ML IV SOLN
INTRAVENOUS | Status: AC
  Filled 2021-08-13: qty 100

## 2021-08-13 MED ORDER — SCOPOLAMINE 1 MG/3DAYS TD PT72
1.0000 | MEDICATED_PATCH | TRANSDERMAL | Status: DC
  Administered 2021-08-13: 1.5 mg via TRANSDERMAL
  Filled 2021-08-13: qty 1

## 2021-08-13 MED ORDER — PROMETHAZINE HCL 25 MG/ML IJ SOLN
INTRAMUSCULAR | Status: AC
  Filled 2021-08-13: qty 1

## 2021-08-13 MED ORDER — LACTATED RINGERS IV SOLN: INTRAVENOUS | Status: DC

## 2021-08-13 MED ORDER — AMPHETAMINE-DEXTROAMPHET ER 5 MG PO CP24
25.0000 mg | ORAL_CAPSULE | Freq: Every day | ORAL | Status: DC
  Filled 2021-08-13 (×2): qty 1

## 2021-08-13 MED ORDER — MIDAZOLAM HCL 2 MG/2ML IJ SOLN
INTRAMUSCULAR | Status: AC
  Filled 2021-08-13: qty 2

## 2021-08-13 MED ORDER — PROPOFOL 10 MG/ML IV BOLUS
INTRAVENOUS | Status: AC
  Filled 2021-08-13: qty 20

## 2021-08-13 MED ORDER — SODIUM CHLORIDE 0.9 % IV SOLN
150.0000 mg | INTRAVENOUS | Status: AC
  Administered 2021-08-13: 150 mg via INTRAVENOUS
  Filled 2021-08-13: qty 5

## 2021-08-13 MED ORDER — SENNOSIDES-DOCUSATE SODIUM 8.6-50 MG PO TABS
1.0000 | ORAL_TABLET | Freq: Every evening | ORAL | Status: DC | PRN
  Administered 2021-08-14: 1 via ORAL
  Filled 2021-08-13: qty 1

## 2021-08-13 MED ORDER — FENTANYL CITRATE (PF) 250 MCG/5ML IJ SOLN
INTRAMUSCULAR | Status: DC | PRN
  Administered 2021-08-13: 100 ug via INTRAVENOUS

## 2021-08-13 MED ORDER — SUGAMMADEX SODIUM 200 MG/2ML IV SOLN
INTRAVENOUS | Status: DC | PRN
  Administered 2021-08-13: 250 mg via INTRAVENOUS

## 2021-08-13 MED ORDER — SODIUM CHLORIDE 0.9 % IR SOLN
Status: DC | PRN
  Administered 2021-08-13: 3000 mL

## 2021-08-13 MED ORDER — ACETAMINOPHEN 325 MG PO TABS
650.0000 mg | ORAL_TABLET | ORAL | Status: DC | PRN
Start: 2021-08-13 — End: 2021-08-14
  Administered 2021-08-14 (×2): 650 mg via ORAL
  Filled 2021-08-13 (×2): qty 2

## 2021-08-13 MED ORDER — SUCCINYLCHOLINE CHLORIDE 200 MG/10ML IV SOSY
PREFILLED_SYRINGE | INTRAVENOUS | Status: DC | PRN
  Administered 2021-08-13: 140 mg via INTRAVENOUS

## 2021-08-13 MED ORDER — CEFTRIAXONE SODIUM 1 G IJ SOLR
1.0000 g | INTRAMUSCULAR | Status: DC
  Administered 2021-08-13: 1 g via INTRAVENOUS
  Filled 2021-08-13: qty 10

## 2021-08-13 MED ORDER — ROCURONIUM BROMIDE 10 MG/ML (PF) SYRINGE
PREFILLED_SYRINGE | INTRAVENOUS | Status: DC | PRN
Start: 2021-08-13 — End: 2021-08-13
  Administered 2021-08-13: 50 mg via INTRAVENOUS

## 2021-08-13 MED ORDER — LIDOCAINE 2% (20 MG/ML) 5 ML SYRINGE
INTRAMUSCULAR | Status: DC | PRN
  Administered 2021-08-13: 60 mg via INTRAVENOUS

## 2021-08-13 MED ORDER — ZOLPIDEM TARTRATE 5 MG PO TABS: 5.0000 mg | ORAL_TABLET | Freq: Every evening | ORAL | Status: DC | PRN

## 2021-08-13 MED ORDER — DEXAMETHASONE SODIUM PHOSPHATE 10 MG/ML IJ SOLN
INTRAMUSCULAR | Status: AC
  Filled 2021-08-13: qty 1

## 2021-08-13 MED ORDER — PROMETHAZINE HCL 25 MG/ML IJ SOLN: 6.2500 mg | INTRAMUSCULAR | Status: DC | PRN

## 2021-08-13 MED ORDER — MEPERIDINE HCL 50 MG/ML IJ SOLN: 6.2500 mg | INTRAMUSCULAR | Status: DC | PRN

## 2021-08-13 MED ORDER — ONDANSETRON HCL 4 MG/2ML IJ SOLN
INTRAMUSCULAR | Status: AC
  Filled 2021-08-13: qty 2

## 2021-08-13 MED ORDER — FLEET ENEMA 7-19 GM/118ML RE ENEM: 1.0000 | ENEMA | Freq: Once | RECTAL | Status: DC | PRN

## 2021-08-13 MED ORDER — HYDROMORPHONE HCL 1 MG/ML IJ SOLN: 0.5000 mg | INTRAMUSCULAR | Status: DC | PRN

## 2021-08-13 MED ORDER — AMISULPRIDE (ANTIEMETIC) 5 MG/2ML IV SOLN: 10.0000 mg | Freq: Once | INTRAVENOUS | Status: DC | PRN

## 2021-08-13 MED ORDER — ONDANSETRON HCL 4 MG/2ML IJ SOLN: 4.0000 mg | INTRAMUSCULAR | Status: DC | PRN

## 2021-08-13 MED ORDER — OXYCODONE HCL 5 MG PO TABS: 5.0000 mg | ORAL_TABLET | ORAL | Status: DC | PRN

## 2021-08-13 MED ORDER — AMISULPRIDE (ANTIEMETIC) 5 MG/2ML IV SOLN
INTRAVENOUS | Status: DC | PRN
  Administered 2021-08-13: 10 mg via INTRAVENOUS

## 2021-08-13 MED ORDER — PROMETHAZINE HCL 25 MG/ML IJ SOLN
INTRAMUSCULAR | Status: DC | PRN
  Administered 2021-08-13 (×2): 6.25 mg via INTRAVENOUS

## 2021-08-13 MED ORDER — KETOROLAC TROMETHAMINE 30 MG/ML IJ SOLN: 30.0000 mg | Freq: Once | INTRAMUSCULAR | Status: DC | PRN

## 2021-08-13 MED ORDER — BISACODYL 10 MG RE SUPP: 10.0000 mg | Freq: Every day | RECTAL | Status: DC | PRN

## 2021-08-13 MED ORDER — HYOSCYAMINE SULFATE 0.125 MG SL SUBL
0.1250 mg | SUBLINGUAL_TABLET | SUBLINGUAL | Status: DC | PRN
  Administered 2021-08-14: 0.125 mg via SUBLINGUAL
  Filled 2021-08-13 (×3): qty 1

## 2021-08-13 MED ORDER — ACETAMINOPHEN 500 MG PO TABS
1000.0000 mg | ORAL_TABLET | Freq: Once | ORAL | Status: DC
  Filled 2021-08-13: qty 2

## 2021-08-13 MED ORDER — HYDROMORPHONE HCL 1 MG/ML IJ SOLN: 0.2500 mg | INTRAMUSCULAR | Status: DC | PRN

## 2021-08-13 MED ORDER — ORAL CARE MOUTH RINSE
15.0000 mL | Freq: Two times a day (BID) | OROMUCOSAL | Status: DC
  Administered 2021-08-14: 15 mL via OROMUCOSAL

## 2021-08-13 MED ORDER — KETOROLAC TROMETHAMINE 30 MG/ML IJ SOLN
INTRAMUSCULAR | Status: DC | PRN
Start: 2021-08-13 — End: 2021-08-13
  Administered 2021-08-13: 30 mg via INTRAVENOUS

## 2021-08-13 MED ORDER — PROPOFOL 10 MG/ML IV BOLUS
INTRAVENOUS | Status: DC | PRN
  Administered 2021-08-13: 200 mg via INTRAVENOUS

## 2021-08-13 MED ORDER — HEPARIN SODIUM (PORCINE) 5000 UNIT/ML IJ SOLN
5000.0000 [IU] | Freq: Three times a day (TID) | INTRAMUSCULAR | Status: DC
  Administered 2021-08-13 – 2021-08-14 (×3): 5000 [IU] via SUBCUTANEOUS
  Filled 2021-08-13 (×3): qty 1

## 2021-08-13 MED ORDER — DEXAMETHASONE SODIUM PHOSPHATE 10 MG/ML IJ SOLN
INTRAMUSCULAR | Status: DC | PRN
  Administered 2021-08-13: 10 mg via INTRAVENOUS

## 2021-08-13 MED ORDER — PROPOFOL 500 MG/50ML IV EMUL
INTRAVENOUS | Status: DC | PRN
  Administered 2021-08-13: 200 ug/kg/min via INTRAVENOUS

## 2021-08-13 MED ORDER — AMISULPRIDE (ANTIEMETIC) 5 MG/2ML IV SOLN
INTRAVENOUS | Status: AC
  Filled 2021-08-13: qty 4

## 2021-08-13 MED ORDER — AMPHETAMINE-DEXTROAMPHET ER 25 MG PO CP24: 25.0000 mg | ORAL_CAPSULE | Freq: Every morning | ORAL | Status: DC

## 2021-08-13 MED ORDER — PHENYLEPHRINE HCL (PRESSORS) 10 MG/ML IV SOLN
INTRAVENOUS | Status: DC | PRN
  Administered 2021-08-13: 6.25 mg via INTRAVENOUS

## 2021-08-13 MED ORDER — TAMSULOSIN HCL 0.4 MG PO CAPS
0.4000 mg | ORAL_CAPSULE | Freq: Every day | ORAL | Status: DC
  Administered 2021-08-13 – 2021-08-14 (×2): 0.4 mg via ORAL
  Filled 2021-08-13 (×2): qty 1

## 2021-08-13 MED ORDER — MIDAZOLAM HCL 5 MG/5ML IJ SOLN
INTRAMUSCULAR | Status: DC | PRN
  Administered 2021-08-13 (×2): 1 mg via INTRAVENOUS

## 2021-08-13 SURGICAL SUPPLY — 22 items
BAG URO CATCHER STRL LF (MISCELLANEOUS) ×2 IMPLANT
BASKET STONE NCOMPASS (UROLOGICAL SUPPLIES) IMPLANT
CATH URET 5FR 28IN OPEN ENDED (CATHETERS) ×1 IMPLANT
CATH URET DUAL LUMEN 6-10FR 50 (CATHETERS) IMPLANT
CLOTH BEACON ORANGE TIMEOUT ST (SAFETY) ×1 IMPLANT
EXTRACTOR STONE NITINOL NGAGE (UROLOGICAL SUPPLIES) IMPLANT
GLOVE SURG POLYISO LF SZ8 (GLOVE) ×2 IMPLANT
GOWN STRL REUS W/TWL XL LVL3 (GOWN DISPOSABLE) ×2 IMPLANT
GUIDEWIRE STR DUAL SENSOR (WIRE) ×2 IMPLANT
IV NS IRRIG 3000ML ARTHROMATIC (IV SOLUTION) ×1 IMPLANT
KIT TURNOVER KIT A (KITS) ×1 IMPLANT
LASER FIB FLEXIVA PULSE ID 365 (Laser) IMPLANT
LASER FIB FLEXIVA PULSE ID 550 (Laser) IMPLANT
LASER FIB FLEXIVA PULSE ID 910 (Laser) IMPLANT
MANIFOLD NEPTUNE II (INSTRUMENTS) ×2 IMPLANT
PACK CYSTO (CUSTOM PROCEDURE TRAY) ×2 IMPLANT
SHEATH URETERAL 12FRX35CM (MISCELLANEOUS) IMPLANT
STENT URET 6FRX24 CONTOUR (STENTS) ×1 IMPLANT
TRACTIP FLEXIVA PULS ID 200XHI (Laser) IMPLANT
TRACTIP FLEXIVA PULSE ID 200 (Laser)
TUBING CONNECTING 10 (TUBING) ×2 IMPLANT
TUBING UROLOGY SET (TUBING) ×2 IMPLANT

## 2021-08-13 NOTE — Anesthesia Postprocedure Evaluation (Signed)
Anesthesia Post Note  Patient: Cathy Johns  Procedure(s) Performed: CYSTOSCOPY WITH LEFT RETROGRADE PYELOGRAM, AND STENT PLACEMENT (Left)     Patient location during evaluation: PACU Anesthesia Type: General Level of consciousness: awake and alert, oriented and patient cooperative Pain management: pain level controlled Vital Signs Assessment: post-procedure vital signs reviewed and stable Respiratory status: spontaneous breathing, nonlabored ventilation and respiratory function stable Cardiovascular status: blood pressure returned to baseline and stable Postop Assessment: no apparent nausea or vomiting Anesthetic complications: no   No notable events documented.  Last Vitals:  Vitals:   08/13/21 1758 08/13/21 1800  BP: 117/73 110/77  Pulse: 99 98  Resp: (!) 29 (!) 23  Temp:  37.6 C  SpO2: 99% 99%    Last Pain:  Vitals:   08/13/21 1800  TempSrc:   PainSc: 0-No pain                 Lannie Fields

## 2021-08-13 NOTE — Anesthesia Procedure Notes (Signed)
Procedure Name: Intubation Date/Time: 08/13/2021 5:22 PM Performed by: Ponciano Ort, CRNA Pre-anesthesia Checklist: Patient identified, Emergency Drugs available, Suction available and Patient being monitored Patient Re-evaluated:Patient Re-evaluated prior to induction Oxygen Delivery Method: Circle system utilized Preoxygenation: Pre-oxygenation with 100% oxygen Induction Type: IV induction, Rapid sequence and Cricoid Pressure applied Laryngoscope Size: Glidescope and 4 (glidescope used for true RSI d/t severe N&V) Grade View: Grade I Tube type: Oral Tube size: 7.0 mm Number of attempts: 1 Airway Equipment and Method: Stylet and Oral airway Placement Confirmation: ETT inserted through vocal cords under direct vision, positive ETCO2 and breath sounds checked- equal and bilateral Secured at: 22 cm Tube secured with: Tape Dental Injury: Teeth and Oropharynx as per pre-operative assessment

## 2021-08-13 NOTE — Transfer of Care (Signed)
Immediate Anesthesia Transfer of Care Note  Patient: [name]  Procedure(s) Performed: CYSTOSCOPY WITH LEFT RETROGRADE PYELOGRAM, AND STENT PLACEMENT (Left)  Patient Location: PACU  Anesthesia Type:General  Level of Consciousness: awake, alert , oriented and patient cooperative  Airway & Oxygen Therapy: Patient Spontanous Breathing and Patient connected to face mask oxygen  Post-op Assessment: Report given to RN and Post -op Vital signs reviewed and stable  Post vital signs: Reviewed and stable  Last Vitals:  Vitals Value Taken Time  BP 110/77 08/13/21 1800  Temp 37.6 C 08/13/21 1800  Pulse 98 08/13/21 1804  Resp 24 08/13/21 1804  SpO2 99 % 08/13/21 1804  Vitals shown include unvalidated device data.  Last Pain:  Vitals:   08/13/21 1800  TempSrc:   PainSc: 0-No pain         Complications: No notable events documented.

## 2021-08-13 NOTE — H&P (View-Only) (Signed)
I have ureteral stone.  HPI: Cathy Johns is a 50 year-old female patient who was referred by Dr. Juliet Rude. Rubin Payor, MD who is here for ureteral stone.  The problem is on the right side.   Cathy Johns is a 50 yo female who was seen in the [facility] ER on 10/29 with right flank pain. She had N/V. She had a CT that showed a 40mm LUVJ stone. Her UA had 6-10 WBC with many bacteria and her WBC was 13.5 but she was afebrile. She had recently been treated for a UTI and her culture on 07/16/21 grew proteus that was treated with bactrim to which it was sensitive. She started having fever yesterday and has nausea and vomiting since yesterday. She had a 72mm stone 8 years ago that she passed. She has had no GU surgery. she has had only sporadic UTI's.      ALLERGIES: None   MEDICATIONS: Adderall  Ambien     GU PSH: None   NON-GU PSH: Appendectomy - 1979     GU PMH: None   NON-GU PMH: None   FAMILY HISTORY: 1 son - Other Death In The Family Father - Other   SOCIAL HISTORY: Marital Status: Married Preferred Language: English; Ethnicity: Not Hispanic Or Latino; Race: White Current Smoking Status: Patient has never smoked.   Tobacco Use Assessment Completed: Used Tobacco in last 30 days? Does not use smokeless tobacco. Drinks 2 drinks per day.  Does not use drugs. Drinks 1 caffeinated drink per day. 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  Gastrointestinal (Upper):   Patient reports nausea, vomiting, and indigestion/ heartburn.   Gastrointestinal (Lower):   Patient reports constipation. Patient denies diarrhea.  Constitutional:   Patient reports fever, night sweats, weight loss, and  fatigue.   Skin:   Patient denies skin rash/ lesion and itching.  Eyes:   Patient denies blurred vision and double vision.  Ears/ Nose/ Throat:   Patient denies sore throat and sinus problems.  Hematologic/Lymphatic:   Patient denies swollen glands and easy bruising.  Cardiovascular:   Patient denies leg swelling and chest pains.  Respiratory:   Patient denies cough and shortness of breath.  Endocrine:   Patient reports excessive thirst.   Musculoskeletal:   Patient denies back pain and joint pain.  Neurological:   Patient reports headaches. Patient denies dizziness.  Psychologic:   Patient denies depression and anxiety.   VITAL SIGNS:      08/13/2021 11:14 AM  Weight 235 lb / 106.59 kg  Height 62 in / 157.48 cm  BP 110/77 mmHg  Pulse 92 /min  Temperature 100 F / 37.7 C  BMI 43.0 kg/m   MULTI-SYSTEM PHYSICAL EXAMINATION:    Constitutional: Obese. No physical deformities. Normally developed. Good grooming. ill-appearing  Neck: Neck symmetrical, not swollen. Normal tracheal position.  Respiratory: Normal breath sounds. No labored breathing, no use of accessory muscles.   Cardiovascular: Regular rate and rhythm. No murmur, no gallop.   Skin: No paleness, no jaundice, no cyanosis. No lesion, no ulcer, no rash.  Neurologic / Psychiatric: Oriented to time, oriented to place, oriented to person. No depression, no anxiety, no agitation.  Gastrointestinal: Abdominal tenderness with  LCVAT and LLQT, obese. No mass, no rigidity.   Musculoskeletal: Normal gait and station of head and neck.     Complexity of Data:  Lab Test Review:   BMP, CBC with Diff  Records Review:   AUA Symptom Score, Previous Patient Records  Urine Test Review:   Urinalysis  X-Ray Review: C.T. Stone Protocol: Reviewed Films. Reviewed Report. Discussed With Patient.     PROCEDURES:          Ceftriaxone 1g - [id number], [id number] Site cleaned with alcohol prep. Ceftriaxone 1gm administered IM. Bandaid applied.   Qty: 1 Adm.  By: Phylliss Blakes  Unit: gram Lot No [id number]  Route: IM Exp. Date 10/14/2023  Freq: None Mfgr.:   Site: Left Buttock         Phenergan 25mg  - , [id number] Site cleaned with alcohol prep. Phenergan 25mg  administered IM. Bandaid applied.   Qty: 25 Adm. By: [id number]  Unit: mg Lot No  Route: IM Exp. Date 08/13/2022  Freq: None Mfgr.:   Site: Right Buttock   ASSESSMENT:      ICD-10 Details  1 GU:   Ureteral calculus - N20.1 Acute, Systemic Symptoms - She has a 89mm LUVJ stone with fever and intractable N/V. I have given her rocephin and phenergan and will get her set up for cystoscopy with left ureteral stenting. I will consider stone retrieval if that can be done with minimal manipulation since the stone is small and distal. I have reviewed the risks of ureteroscopy including bleeding, infection, ureteral injury, need for a stent or secondary procedures, thrombotic events and anesthetic complications.   2   Pyelonephritis - N10 Acute, Systemic Symptoms - She had proteus on her recent culture that was sensitive to Rocephin.    PLAN:           Schedule Return Visit/Planned Activity: ASAP - Schedule Surgery  Procedure: 08/13/2021 at [facility] - [phone] - Ceftriaxone 1g (Injection, Ceftriaxone Sodium, Per 250 Mg) [facility], [id number]  Procedure: 08/13/2021 - Cystoscopy Insert Stent - - [id number], left  Procedure: 08/13/2021 at [facility] - [phone] - Phenergan 25mg  (Phenergan Per 50 Mg) - [id number], [facility]          Document Letter(s):  Created for Patient: Clinical Summary         Notes:   CC: [id number] PA.         Next Appointment:      Next Appointment: 08/24/2021 09:45 AM    Appointment Type: Postoperative Appointment    Location: [facility] [phone]    Provider: [employer], NP    Reason for Visit: POST OP

## 2021-08-13 NOTE — Plan of Care (Signed)

## 2021-08-13 NOTE — Op Note (Signed)
Procedure: 1.  Cystoscopy with left retrograde pyelogram and interpretation. 2.  Cystoscopy with insertion of left double-J stent. 3.  Application of fluoroscopy.  Preop diagnosis: Left distal ureteral stone with possible infection.  Postop diagnosis: Same.  Surgeon: Dr. Bjorn Pippin.  Anesthesia: General.  Drains: 6 French by 24 cm contour double-J stent without tether.  Specimen: None.  EBL: None.  Complications: None.  Indications: Cathy Johns is a 50 year old female who presented the office today after multiple ER visits with a 4 to 5 mm left distal ureteral stone and a low-grade fever with concern for infection.  She is elected undergo cystoscopy with stent placement.  Procedure: She was given Rocephin in the office.  She was taken operating room where general anesthetic was induced.  She was placed in lithotomy position and fitted with PAS hose.  Her perineum and genitalia were prepped Betadine solution and she was draped in usual sterile fashion.  Cystoscopy was performed using a 21 [place] scope and 30 degree lens.  Examination revealed a normal urethra.  The bladder wall was smooth and pale without tumors, stones or inflammation. Ureteral orifices were unremarkable.  The left ureteral orifice was cannulated with a 5 [place] open-ended catheter and contrast was gently instilled.  The left retrograde pyelogram demonstrated a filling defect in the distal ureter consistent with the stone with minimal proximal dilation.  A sensor wire was then passed through the open-ended catheter by the stone to the kidney.  There was efflux of mildly turbid urine.  The open-ended catheter was removed and a 6 [place] by 24 cm contour double-J stent was advanced the kidney under fluoroscopic guidance.  Wire was removed, leaving a good coil in the kidney and a good coil in the bladder.  The bladder was then drained and the cystoscope was removed.  She was then taken down from lithotomy position, her anesthetic  was reversed and she was moved recovery in stable condition.  There were no complications.

## 2021-08-13 NOTE — Anesthesia Preprocedure Evaluation (Addendum)
Anesthesia Evaluation  Patient identified by MRN, date of birth, ID band Patient awake    Reviewed: Allergy & Precautions, NPO status , Patient's Chart, lab work & pertinent test results  History of Anesthesia Complications (+) PONV and history of anesthetic complications  Airway Mallampati: III  TM Distance: >3 FB Neck ROM: Full    Dental no notable dental hx. (+) Teeth Intact, Dental Advisory Given   Pulmonary neg pulmonary ROS,    Pulmonary exam normal breath sounds clear to auscultation       Cardiovascular negative cardio ROS Normal cardiovascular exam Rhythm:Regular Rate:Normal     Neuro/Psych negative neurological ROS  negative psych ROS   GI/Hepatic negative GI ROS, Neg liver ROS,   Endo/Other  Morbid obesityBMI 43  Renal/GU Renal disease (L ureteral stone)Cr 1.23  negative genitourinary   Musculoskeletal negative musculoskeletal ROS (+)   Abdominal (+) + obese,   Peds  Hematology negative hematology ROS (+) hct 42.4   Anesthesia Other Findings   Reproductive/Obstetrics negative OB ROS Urine preg neg                           Anesthesia Physical Anesthesia Plan  ASA: 3  Anesthesia Plan: General   Post-op Pain Management:    Induction: Intravenous, Rapid sequence and Cricoid pressure planned  PONV Risk Score and Plan: 4 or greater and Ondansetron, Dexamethasone, Midazolam, Scopolamine patch - Pre-op, Treatment may vary due to age or medical condition, Aprepitant, Propofol infusion, TIVA, Diphenhydramine and Promethazine  Airway Management Planned: Oral ETT and Video Laryngoscope Planned  Additional Equipment: None  Intra-op Plan:   Post-operative Plan: Extubation in OR  Informed Consent: I have reviewed the patients History and Physical, chart, labs and discussed the procedure including the risks, benefits and alternatives for the proposed anesthesia with the patient  or authorized representative who has indicated his/her understanding and acceptance.     Dental advisory given  Plan Discussed with: CRNA  Anesthesia Plan Comments: (Actively vomiting in preop, per pt has been unable to keep anything down for days  Antiemetics given in preop: IV zofran, IV barhemysis, IV phenergan, IV emend)      Anesthesia Quick Evaluation

## 2021-08-13 NOTE — H&P (Signed)
I have ureteral stone.  HPI: Cathy Johns is a 50 year-old female patient who was referred by Dr. Juliet Rude. Rubin Payor, MD who is here for ureteral stone.  The problem is on the right side.   Cathy Johns is a 50 yo female who was seen in the [facility] ER on 10/29 with right flank pain. She had N/V. She had a CT that showed a 40mm LUVJ stone. Her UA had 6-10 WBC with many bacteria and her WBC was 13.5 but she was afebrile. She had recently been treated for a UTI and her culture on 07/16/21 grew proteus that was treated with bactrim to which it was sensitive. She started having fever yesterday and has nausea and vomiting since yesterday. She had a 72mm stone 8 years ago that she passed. She has had no GU surgery. she has had only sporadic UTI's.      ALLERGIES: None   MEDICATIONS: Adderall  Ambien     GU PSH: None   NON-GU PSH: Appendectomy - 1979     GU PMH: None   NON-GU PMH: None   FAMILY HISTORY: 1 son - Other Death In The Family Father - Other   SOCIAL HISTORY: Marital Status: Married Preferred Language: English; Ethnicity: Not Hispanic Or Latino; Race: White Current Smoking Status: Patient has never smoked.   Tobacco Use Assessment Completed: Used Tobacco in last 30 days? Does not use smokeless tobacco. Drinks 2 drinks per day.  Does not use drugs. Drinks 1 caffeinated drink per day. 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  Gastrointestinal (Upper):   Patient reports nausea, vomiting, and indigestion/ heartburn.   Gastrointestinal (Lower):   Patient reports constipation. Patient denies diarrhea.  Constitutional:   Patient reports fever, night sweats, weight loss, and  fatigue.   Skin:   Patient denies skin rash/ lesion and itching.  Eyes:   Patient denies blurred vision and double vision.  Ears/ Nose/ Throat:   Patient denies sore throat and sinus problems.  Hematologic/Lymphatic:   Patient denies swollen glands and easy bruising.  Cardiovascular:   Patient denies leg swelling and chest pains.  Respiratory:   Patient denies cough and shortness of breath.  Endocrine:   Patient reports excessive thirst.   Musculoskeletal:   Patient denies back pain and joint pain.  Neurological:   Patient reports headaches. Patient denies dizziness.  Psychologic:   Patient denies depression and anxiety.   VITAL SIGNS:      08/13/2021 11:14 AM  Weight 235 lb / 106.59 kg  Height 62 in / 157.48 cm  BP 110/77 mmHg  Pulse 92 /min  Temperature 100 F / 37.7 C  BMI 43.0 kg/m   MULTI-SYSTEM PHYSICAL EXAMINATION:    Constitutional: Obese. No physical deformities. Normally developed. Good grooming. ill-appearing  Neck: Neck symmetrical, not swollen. Normal tracheal position.  Respiratory: Normal breath sounds. No labored breathing, no use of accessory muscles.   Cardiovascular: Regular rate and rhythm. No murmur, no gallop.   Skin: No paleness, no jaundice, no cyanosis. No lesion, no ulcer, no rash.  Neurologic / Psychiatric: Oriented to time, oriented to place, oriented to person. No depression, no anxiety, no agitation.  Gastrointestinal: Abdominal tenderness with  LCVAT and LLQT, obese. No mass, no rigidity.   Musculoskeletal: Normal gait and station of head and neck.     Complexity of Data:  Lab Test Review:   BMP, CBC with Diff  Records Review:   AUA Symptom Score, Previous Patient Records  Urine Test Review:   Urinalysis  X-Ray Review: C.T. Stone Protocol: Reviewed Films. Reviewed Report. Discussed With Patient.     PROCEDURES:          Ceftriaxone 1g - C338645, [id number] Site cleaned with alcohol prep. Ceftriaxone 1gm administered IM. Bandaid applied.   Qty: 1 Adm.  By: Phylliss Blakes  Unit: gram Lot No [id number]  Route: IM Exp. Date 10/14/2023  Freq: None Mfgr.:   Site: Left Buttock         Phenergan 25mg  - , [id number] Site cleaned with alcohol prep. Phenergan 25mg  administered IM. Bandaid applied.   Qty: 25 Adm. By: [id number]  Unit: mg Lot No  Route: IM Exp. Date 08/13/2022  Freq: None Mfgr.:   Site: Right Buttock   ASSESSMENT:      ICD-10 Details  1 GU:   Ureteral calculus - N20.1 Acute, Systemic Symptoms - She has a 45mm LUVJ stone with fever and intractable N/V. I have given her rocephin and phenergan and will get her set up for cystoscopy with left ureteral stenting. I will consider stone retrieval if that can be done with minimal manipulation since the stone is small and distal. I have reviewed the risks of ureteroscopy including bleeding, infection, ureteral injury, need for a stent or secondary procedures, thrombotic events and anesthetic complications.   2   Pyelonephritis - N10 Acute, Systemic Symptoms - She had proteus on her recent culture that was sensitive to Rocephin.    PLAN:           Schedule Return Visit/Planned Activity: ASAP - Schedule Surgery  Procedure: 08/13/2021 at [facility] - [phone] - Ceftriaxone 1g (Injection, Ceftriaxone Sodium, Per 250 Mg) [facility], [id number]  Procedure: 08/13/2021 - Cystoscopy Insert Stent - - [id number], left  Procedure: 08/13/2021 at [facility] - [phone] - Phenergan 25mg  (Phenergan Per 50 Mg) - [id number], [facility]          Document Letter(s):  Created for Patient: Clinical Summary         Notes:   CC: [id number] PA.         Next Appointment:      Next Appointment: 08/24/2021 09:45 AM    Appointment Type: Postoperative Appointment    Location: [facility] [phone]    Provider: [employer], NP    Reason for Visit: POST OP

## 2021-08-14 ENCOUNTER — Encounter (HOSPITAL_COMMUNITY): Payer: Self-pay | Admitting: Urology

## 2021-08-14 DIAGNOSIS — N201 Calculus of ureter: Secondary | ICD-10-CM | POA: Diagnosis not present

## 2021-08-14 LAB — HIV ANTIBODY (ROUTINE TESTING W REFLEX): HIV Screen 4th Generation wRfx: NONREACTIVE

## 2021-08-14 MED ORDER — SULFAMETHOXAZOLE-TRIMETHOPRIM 800-160 MG PO TABS: 1.0000 | ORAL_TABLET | Freq: Two times a day (BID) | ORAL | 0 refills | Status: DC

## 2021-08-14 MED ORDER — SULFAMETHOXAZOLE-TRIMETHOPRIM 800-160 MG PO TABS: 1.0000 | ORAL_TABLET | Freq: Two times a day (BID) | ORAL | Status: DC

## 2021-08-14 NOTE — Progress Notes (Signed)
1 Day Post-Op  Subjective: She has less pain and nausea with the stent in place.   Tmax is 99.9.   She has facial flushing with some chills and sweats.  She has stent irritation.  Cr and WBC have improved.  ROS:  Review of Systems  Gastrointestinal:  Positive for abdominal pain.  Genitourinary:  Positive for dysuria and frequency.   Anti-infectives: Anti-infectives (From admission, onward)    Start     Dose/Rate Route Frequency Ordered Stop   08/14/21 2000  sulfamethoxazole-trimethoprim (BACTRIM DS) 800-160 MG per tablet 1 tablet        1 tablet Oral Every 12 hours 08/14/21 0735     08/13/21 2000  cefTRIAXone (ROCEPHIN) 1 g in sodium chloride 0.9 % 100 mL IVPB  Status:  Discontinued        1 g 200 mL/hr over 30 Minutes Intravenous Every 24 hours 08/13/21 1847 08/14/21 0733       Current Facility-Administered Medications  Medication Dose Route Frequency Provider Last Rate Last Admin   0.45 % NaCl with KCl 20 mEq / L infusion   Intravenous Continuous Bjorn Pippin, MD 100 mL/hr at 08/13/21 2122 New Bag at 08/13/21 2122   acetaminophen (TYLENOL) tablet 650 mg  650 mg Oral Q4H PRN Bjorn Pippin, MD   650 mg at 08/14/21 0404   amphetamine-dextroamphetamine (ADDERALL XR) 24 hr capsule 25 mg  25 mg Oral Daily Bjorn Pippin, MD       bisacodyl (DULCOLAX) suppository 10 mg  10 mg Rectal Daily PRN Bjorn Pippin, MD       heparin injection 5,000 Units  5,000 Units Subcutaneous Q8H Bjorn Pippin, MD   5,000 Units at 08/14/21 0505   HYDROmorphone (DILAUDID) injection 0.5-1 mg  0.5-1 mg Intravenous Q2H PRN Bjorn Pippin, MD       hyoscyamine (LEVSIN SL) SL tablet 0.125 mg  0.125 mg Sublingual Q4H PRN Bjorn Pippin, MD   0.125 mg at 08/14/21 0505   influenza vac split quadrivalent PF (FLUARIX) injection 0.5 mL  0.5 mL Intramuscular Tomorrow-1000 Bjorn Pippin, MD       MEDLINE mouth rinse  15 mL Mouth Rinse BID Bjorn Pippin, MD       ondansetron [facility]) injection 4 mg  4 mg Intravenous Q4H PRN Bjorn Pippin, MD        oxyCODONE (Oxy IR/ROXICODONE) immediate release tablet 5 mg  5 mg Oral Q4H PRN Bjorn Pippin, MD       senna-docusate (Senokot-S) tablet 1 tablet  1 tablet Oral QHS PRN Bjorn Pippin, MD   1 tablet at 08/14/21 0404   sodium phosphate (FLEET) 7-19 GM/118ML enema 1 enema  1 enema Rectal Once PRN Bjorn Pippin, MD       sulfamethoxazole-trimethoprim (BACTRIM DS) 800-160 MG per tablet 1 tablet  1 tablet Oral Q12H Bjorn Pippin, MD       tamsulosin [facility]) capsule 0.4 mg  0.4 mg Oral Daily Bjorn Pippin, MD   0.4 mg at 08/13/21 1956   zolpidem (AMBIEN) tablet 5 mg  5 mg Oral QHS PRN Bjorn Pippin, MD         Objective: Vital signs in last 24 hours: Temp:  [97.9 F (36.6 C)-99.9 F (37.7 C)] 97.9 F (36.6 C) (11/01 0640) Pulse Rate:  [55-99] 55 (11/01 0640) Resp:  [16-29] 18 (11/01 0640) BP: (110-143)/(67-94) 130/94 (11/01 0640) SpO2:  [96 %-100 %] 96 % (11/01 0640) Weight:  [105.1 kg-106.6 kg] 105.1 kg (10/31 1844)  Intake/Output from previous day:  10/31 0701 - 11/01 0700 In: 2212.1 [I.V.:2012.1; IV Piggyback:200] Out: -  Intake/Output this shift: No intake/output data recorded.   Physical Exam Vitals reviewed.  Constitutional:      Appearance: Normal appearance.  Cardiovascular:     Rate and Rhythm: Normal rate and regular rhythm.  Pulmonary:     Effort: Pulmonary effort is normal. No respiratory distress.  Abdominal:     Palpations: Abdomen is soft.     Tenderness: There is abdominal tenderness (mild LLQ).  Skin:    General: Skin is warm.     Comments: Face is slightly red and moist  Neurological:     Mental Status: She is alert.    Lab Results:  Recent Labs    08/11/21 1639 08/13/21 1953  WBC 13.5* 10.5  HGB 14.6 13.8  HCT 42.4 40.9  PLT 225 168   BMET Recent Labs    08/11/21 1639 08/13/21 1953  NA 135  --   K 3.5  --   CL 101  --   CO2 23  --   GLUCOSE 118*  --   BUN 18  --   CREATININE 1.23* 0.90  CALCIUM 9.1  --    PT/INR No results for input(s):  LABPROT, INR in the last 72 hours. ABG No results for input(s): PHART, HCO3 in the last 72 hours.  Invalid input(s): PCO2, PO2  Studies/Results: DG C-Arm 1-60 Min  Result Date: 08/13/2021 CLINICAL DATA:  Cystoscopy with left retrograde pyelogram. Stent placement. EXAM: DG C-ARM 1-60 MIN CONTRAST:  Utilized FLUOROSCOPY TIME:  Fluoroscopy Time:  12 seconds Number of Acquired Spot Images: 5 COMPARISON:  CT renal stone 08/11/2021. FINDINGS: Small filling defect identified in the distal ureter compatible with obstructing stone as seen on recent CT. Left ureteral stent placed. IMPRESSION: Left ureteral stent placed. Electronically Signed   By: Darliss Cheney M.D.   On: 08/13/2021 18:43     Assessment and Plan: Left distal stone with infection.  She is improving s/p left stent insertion.  I will arrange ureteroscopy in the next week or so.    Facial flushing and sweats with chills.  I will stop the rocephin and reassess later.  If she doesn't improve, I will consult the hospitalists.  Bactrim ordered in place of the rocephin.       LOS: 0 days    Bjorn Pippin 08/14/2021 [phone] Patient ID: Cathy Johns, female   DOB: May 21, 1971, 50 y.o.   MRN: [id number]

## 2021-08-14 NOTE — Discharge Summary (Signed)
Physician Discharge Summary  Patient ID: Cathy Johns MRN: [id number] DOB/AGE: [id number] 49 y.o.  Admit date: 08/13/2021 Discharge date: 08/14/2021  Admission Diagnoses:  Left ureteral stone  Discharge Diagnoses:  Principal Problem:   Left ureteral stone Active Problems:   Pyelonephritis   Past Medical History:  Diagnosis Date   ADHD    Kidney stones     Surgeries: Procedure(s): CYSTOSCOPY WITH LEFT RETROGRADE PYELOGRAM, AND STENT PLACEMENT on 08/13/2021   Consultants (if any):   Discharged Condition: Improved  Hospital Course: Cathy Johns is an 50 y.o. female who was admitted 08/13/2021 with a diagnosis of Left ureteral stone and went to the operating room on 08/13/2021 and underwent the above named procedures.  She is doing better postop with reduced pain and no fever.  She had some sweats in the night and facial flushing which she had with a prior antibiotic.   That has improved and she feels ready for discharge.   She was given perioperative antibiotics:  Anti-infectives (From admission, onward)    Start     Dose/Rate Route Frequency Ordered Stop   08/14/21 2000  sulfamethoxazole-trimethoprim (BACTRIM DS) 800-160 MG per tablet 1 tablet        1 tablet Oral Every 12 hours 08/14/21 0735     08/14/21 0000  sulfamethoxazole-trimethoprim (BACTRIM DS) 800-160 MG tablet        1 tablet Oral 2 times daily 08/14/21 1139     08/13/21 2000  cefTRIAXone (ROCEPHIN) 1 g in sodium chloride 0.9 % 100 mL IVPB  Status:  Discontinued        1 g 200 mL/hr over 30 Minutes Intravenous Every 24 hours 08/13/21 1847 08/14/21 0733     .  She was given sequential compression devices for DVT prophylaxis.  She benefited maximally from the hospital stay and there were no complications.    Recent vital signs:  Vitals:   08/14/21 0249 08/14/21 0640  BP: 128/88 (!) 130/94  Pulse: 70 (!) 55  Resp: 16 18  Temp: 98.1 F (36.7 C) 97.9 F (36.6 C)  SpO2: 97% 96%    Recent laboratory  studies:  Lab Results  Component Value Date   HGB 13.8 08/13/2021   HGB 14.6 08/11/2021   HGB 14.0 03/16/2015   Lab Results  Component Value Date   WBC 10.5 08/13/2021   PLT 168 08/13/2021   No results found for: INR Lab Results  Component Value Date   NA 135 08/11/2021   K 3.5 08/11/2021   CL 101 08/11/2021   CO2 23 08/11/2021   BUN 18 08/11/2021   CREATININE 0.90 08/13/2021   GLUCOSE 118 (H) 08/11/2021    Discharge Medications:   Allergies as of 08/14/2021       Reactions   Brassica Oleracea Hives, Other (See Comments)   Broccoli        Medication List     TAKE these medications    amphetamine-dextroamphetamine 25 MG 24 hr capsule Commonly known as: ADDERALL XR Take 25 mg by mouth in the morning.   ondansetron 4 MG disintegrating tablet Commonly known as: Zofran ODT Take 1 tablet (4 mg total) by mouth every 8 (eight) hours as needed for nausea or vomiting. What changed: reasons to take this   ondansetron 4 MG tablet Commonly known as: ZOFRAN Take 1 tablet (4 mg total) by mouth every 8 (eight) hours as needed for nausea or vomiting.   oxyCODONE-acetaminophen 5-325 MG tablet Commonly known as: PERCOCET/ROXICET Take 1  tablet by mouth every 6 (six) hours as needed for severe pain. What changed: Another medication with the same name was removed. Continue taking this medication, and follow the directions you see here.   sulfamethoxazole-trimethoprim 800-160 MG tablet Commonly known as: BACTRIM DS Take 1 tablet by mouth 2 (two) times daily.   tamsulosin 0.4 MG Caps capsule Commonly known as: FLOMAX Take 1 capsule (0.4 mg total) by mouth daily for 7 days.   Tums 500 MG chewable tablet Generic drug: calcium carbonate Chew 1-2 tablets by mouth as needed for indigestion or heartburn.   zolpidem 10 MG tablet Commonly known as: AMBIEN Take 10 mg by mouth at bedtime.        Diagnostic Studies: DG C-Arm 1-60 Min  Result Date: 08/13/2021 CLINICAL DATA:   Cystoscopy with left retrograde pyelogram. Stent placement. EXAM: DG C-ARM 1-60 MIN CONTRAST:  Utilized FLUOROSCOPY TIME:  Fluoroscopy Time:  12 seconds Number of Acquired Spot Images: 5 COMPARISON:  CT renal stone 08/11/2021. FINDINGS: Small filling defect identified in the distal ureter compatible with obstructing stone as seen on recent CT. Left ureteral stent placed. IMPRESSION: Left ureteral stent placed. Electronically Signed   By: Darliss Cheney M.D.   On: 08/13/2021 18:43   CT Renal Stone Study  Result Date: 08/11/2021 CLINICAL DATA:  Left lower quadrant pain. EXAM: CT ABDOMEN AND PELVIS WITHOUT CONTRAST TECHNIQUE: Multidetector CT imaging of the abdomen and pelvis was performed following the standard protocol without IV contrast. COMPARISON:  March 16, 2015 FINDINGS: Lower chest: No acute abnormality. Hepatobiliary: Stable cystic appearing areas are seen within the right lobe of the liver. No gallstones, gallbladder wall thickening, or biliary dilatation. Pancreas: Unremarkable. No pancreatic ductal dilatation or surrounding inflammatory changes. Spleen: Normal in size without focal abnormality. Adrenals/Urinary Tract: Adrenal glands are unremarkable. Kidneys are normal in size, without focal lesions. A 4 mm obstructing renal stone is seen at the left UVJ with moderate severity left-sided hydronephrosis, hydroureter and perinephric inflammatory fat stranding. 2 mm nonobstructing renal stones are seen within both kidneys. Bladder is unremarkable. Stomach/Bowel: Stomach is within normal limits. The appendix is surgically absent. No evidence of bowel wall thickening, distention, or inflammatory changes. Vascular/Lymphatic: No significant vascular findings are present. No enlarged abdominal or pelvic lymph nodes. Reproductive: Uterus and bilateral adnexa are unremarkable. Other: No abdominal wall hernia or abnormality. No abdominopelvic ascites. Musculoskeletal: No acute or significant osseous findings.  IMPRESSION: 1. 4 mm obstructing renal stone at the left UVJ. 2. Bilateral 2 mm nonobstructing renal calculi. Electronically Signed   By: Aram Candela M.D.   On: 08/11/2021 19:18    Disposition: Discharge disposition: 01-Home or Self Care       Discharge Instructions     Discontinue IV   Complete by: As directed         Follow-up Information     Bjorn Pippin, MD Follow up.   Specialty: Urology Why: This office will call to arrange your next procedure. Contact information: [address] [place] [address] [phone]                  Signed: Bjorn Pippin 08/14/2021, 11:40 AM

## 2021-08-15 ENCOUNTER — Other Ambulatory Visit: Payer: Self-pay | Admitting: Urology

## 2021-08-23 ENCOUNTER — Encounter (HOSPITAL_BASED_OUTPATIENT_CLINIC_OR_DEPARTMENT_OTHER): Payer: Self-pay | Admitting: Urology

## 2021-08-23 ENCOUNTER — Other Ambulatory Visit: Payer: Self-pay

## 2021-08-23 NOTE — Progress Notes (Signed)
Spoke w/ via phone for pre-op interview--- pt Lab needs dos----   no            Lab results------pt had CBC/CRE done 08-13-2021 results in epic COVID test -----patient states asymptomatic no test needed Arrive at ------- 0830 on 08-28-2021 NPO after MN NO Solid Food.  Clear liquids from MN until--- 0730 Med rec completed Medications to take morning of surgery ----- none Diabetic medication ----- n/a Patient instructed no nail polish to be worn day of surgery Patient instructed to bring photo id and insurance card day of surgery Patient aware to have Driver (ride ) / caregiver for 24 hours after surgery --husband, james Patient Special Instructions ----- n/a Pre-Op special Istructions ----- n/a Patient verbalized understanding of instructions that were given at this phone interview. Patient denies shortness of breath, chest pain, fever, cough at this phone interview.

## 2021-08-27 ENCOUNTER — Other Ambulatory Visit: Payer: Self-pay | Admitting: Urology

## 2021-08-28 ENCOUNTER — Ambulatory Visit (HOSPITAL_BASED_OUTPATIENT_CLINIC_OR_DEPARTMENT_OTHER): Payer: BC Managed Care – PPO | Admitting: Anesthesiology

## 2021-08-28 ENCOUNTER — Encounter (HOSPITAL_BASED_OUTPATIENT_CLINIC_OR_DEPARTMENT_OTHER): Payer: Self-pay | Admitting: Urology

## 2021-08-28 ENCOUNTER — Encounter (HOSPITAL_BASED_OUTPATIENT_CLINIC_OR_DEPARTMENT_OTHER)
Admission: RE | Disposition: A | Discharge status: Home / Self Care | Payer: Self-pay | Source: Ambulatory Visit | Attending: Urology

## 2021-08-28 ENCOUNTER — Ambulatory Visit (HOSPITAL_BASED_OUTPATIENT_CLINIC_OR_DEPARTMENT_OTHER)
Admission: RE | Admit: 2021-08-28 | Discharge: 2021-08-28 | Disposition: A | Discharge status: Home / Self Care | Payer: BC Managed Care – PPO | Source: Ambulatory Visit | Attending: Urology | Admitting: Urology

## 2021-08-28 DIAGNOSIS — Z6841 Body Mass Index (BMI) 40.0 and over, adult: Secondary | ICD-10-CM | POA: Insufficient documentation

## 2021-08-28 DIAGNOSIS — N2889 Other specified disorders of kidney and ureter: Secondary | ICD-10-CM | POA: Diagnosis not present

## 2021-08-28 DIAGNOSIS — N201 Calculus of ureter: Secondary | ICD-10-CM | POA: Insufficient documentation

## 2021-08-28 HISTORY — PX: CYSTOSCOPY/URETEROSCOPY/HOLMIUM LASER/STENT PLACEMENT: SHX6546

## 2021-08-28 HISTORY — DX: Calculus of ureter: N20.1

## 2021-08-28 HISTORY — DX: Urgency of urination: R39.15

## 2021-08-28 HISTORY — DX: Personal history of urinary calculi: Z87.442

## 2021-08-28 HISTORY — DX: Calculus of kidney: N20.0

## 2021-08-28 LAB — POCT PREGNANCY, URINE: Preg Test, Ur: NEGATIVE

## 2021-08-28 SURGERY — CYSTOSCOPY/URETEROSCOPY/HOLMIUM LASER/STENT PLACEMENT
Anesthesia: General | Site: Ureter | Laterality: Left

## 2021-08-28 MED ORDER — ONDANSETRON HCL 4 MG/2ML IJ SOLN
INTRAMUSCULAR | Status: DC | PRN
  Administered 2021-08-28: 4 mg via INTRAVENOUS

## 2021-08-28 MED ORDER — ACETAMINOPHEN 500 MG PO TABS
1000.0000 mg | ORAL_TABLET | Freq: Once | ORAL | Status: AC
  Administered 2021-08-28: 1000 mg via ORAL

## 2021-08-28 MED ORDER — SODIUM CHLORIDE 0.9 % IR SOLN
Status: DC | PRN
  Administered 2021-08-28: 3000 mL

## 2021-08-28 MED ORDER — DEXAMETHASONE SODIUM PHOSPHATE 10 MG/ML IJ SOLN
INTRAMUSCULAR | Status: AC
  Filled 2021-08-28: qty 1

## 2021-08-28 MED ORDER — PROPOFOL 1000 MG/100ML IV EMUL
INTRAVENOUS | Status: AC
  Filled 2021-08-28: qty 100

## 2021-08-28 MED ORDER — SCOPOLAMINE 1 MG/3DAYS TD PT72
1.0000 | MEDICATED_PATCH | TRANSDERMAL | Status: DC
  Administered 2021-08-28: 1.5 mg via TRANSDERMAL

## 2021-08-28 MED ORDER — SODIUM CHLORIDE 0.9 % IV SOLN: 250.0000 mL | INTRAVENOUS | Status: DC | PRN

## 2021-08-28 MED ORDER — KETOROLAC TROMETHAMINE 30 MG/ML IJ SOLN
INTRAMUSCULAR | Status: DC | PRN
  Administered 2021-08-28: 30 mg via INTRAVENOUS

## 2021-08-28 MED ORDER — SODIUM CHLORIDE 0.9 % IV SOLN
1.0000 g | Freq: Once | INTRAVENOUS | Status: AC
  Administered 2021-08-28: 1 g via INTRAVENOUS
  Filled 2021-08-28: qty 1

## 2021-08-28 MED ORDER — IOHEXOL 300 MG/ML  SOLN
INTRAMUSCULAR | Status: DC | PRN
  Administered 2021-08-28: 10 mL

## 2021-08-28 MED ORDER — MIDAZOLAM HCL 2 MG/2ML IJ SOLN
INTRAMUSCULAR | Status: AC
  Filled 2021-08-28: qty 2

## 2021-08-28 MED ORDER — ONDANSETRON HCL 4 MG/2ML IJ SOLN
INTRAMUSCULAR | Status: AC
  Filled 2021-08-28: qty 2

## 2021-08-28 MED ORDER — FENTANYL CITRATE (PF) 100 MCG/2ML IJ SOLN
INTRAMUSCULAR | Status: AC
  Filled 2021-08-28: qty 2

## 2021-08-28 MED ORDER — OXYCODONE HCL 5 MG PO TABS: 5.0000 mg | ORAL_TABLET | ORAL | Status: DC | PRN

## 2021-08-28 MED ORDER — MIDAZOLAM HCL 2 MG/2ML IJ SOLN
INTRAMUSCULAR | Status: DC | PRN
  Administered 2021-08-28: 2 mg via INTRAVENOUS

## 2021-08-28 MED ORDER — ACETAMINOPHEN 325 MG PO TABS: 650.0000 mg | ORAL_TABLET | ORAL | Status: DC | PRN

## 2021-08-28 MED ORDER — ACETAMINOPHEN 500 MG PO TABS
ORAL_TABLET | ORAL | Status: AC
  Filled 2021-08-28: qty 2

## 2021-08-28 MED ORDER — SCOPOLAMINE 1 MG/3DAYS TD PT72
MEDICATED_PATCH | TRANSDERMAL | Status: AC
  Filled 2021-08-28: qty 1

## 2021-08-28 MED ORDER — PROPOFOL 500 MG/50ML IV EMUL
INTRAVENOUS | Status: DC | PRN
  Administered 2021-08-28: 150 ug/kg/min via INTRAVENOUS

## 2021-08-28 MED ORDER — ACETAMINOPHEN 325 MG RE SUPP: 650.0000 mg | RECTAL | Status: DC | PRN

## 2021-08-28 MED ORDER — MORPHINE SULFATE (PF) 4 MG/ML IV SOLN: 2.0000 mg | INTRAVENOUS | Status: DC | PRN

## 2021-08-28 MED ORDER — KETOROLAC TROMETHAMINE 30 MG/ML IJ SOLN
INTRAMUSCULAR | Status: AC
  Filled 2021-08-28: qty 1

## 2021-08-28 MED ORDER — DEXAMETHASONE SODIUM PHOSPHATE 10 MG/ML IJ SOLN
INTRAMUSCULAR | Status: DC | PRN
  Administered 2021-08-28: 4 mg via INTRAVENOUS

## 2021-08-28 MED ORDER — PROPOFOL 10 MG/ML IV BOLUS
INTRAVENOUS | Status: DC | PRN
  Administered 2021-08-28: 200 mg via INTRAVENOUS

## 2021-08-28 MED ORDER — FENTANYL CITRATE (PF) 100 MCG/2ML IJ SOLN
INTRAMUSCULAR | Status: DC | PRN
  Administered 2021-08-28: 50 ug via INTRAVENOUS
  Administered 2021-08-28 (×2): 25 ug via INTRAVENOUS

## 2021-08-28 MED ORDER — FENTANYL CITRATE (PF) 100 MCG/2ML IJ SOLN: 25.0000 ug | INTRAMUSCULAR | Status: DC | PRN

## 2021-08-28 MED ORDER — LACTATED RINGERS IV SOLN: INTRAVENOUS | Status: DC

## 2021-08-28 MED ORDER — APREPITANT 40 MG PO CAPS
40.0000 mg | ORAL_CAPSULE | Freq: Once | ORAL | Status: AC
  Administered 2021-08-28: 40 mg via ORAL

## 2021-08-28 MED ORDER — WHITE PETROLATUM EX OINT
TOPICAL_OINTMENT | CUTANEOUS | Status: AC
  Filled 2021-08-28: qty 5

## 2021-08-28 MED ORDER — SODIUM CHLORIDE 0.9% FLUSH: 3.0000 mL | INTRAVENOUS | Status: DC | PRN

## 2021-08-28 MED ORDER — LIDOCAINE 2% (20 MG/ML) 5 ML SYRINGE
INTRAMUSCULAR | Status: AC
  Filled 2021-08-28: qty 5

## 2021-08-28 MED ORDER — SODIUM CHLORIDE 0.9% FLUSH: 3.0000 mL | Freq: Two times a day (BID) | INTRAVENOUS | Status: DC

## 2021-08-28 MED ORDER — LIDOCAINE 2% (20 MG/ML) 5 ML SYRINGE
INTRAMUSCULAR | Status: DC | PRN
  Administered 2021-08-28: 60 mg via INTRAVENOUS

## 2021-08-28 MED ORDER — APREPITANT 40 MG PO CAPS
ORAL_CAPSULE | ORAL | Status: AC
  Filled 2021-08-28: qty 1

## 2021-08-28 SURGICAL SUPPLY — 27 items
BAG DRAIN URO-CYSTO SKYTR STRL (DRAIN) ×3 IMPLANT
BAG DRN UROCATH (DRAIN) ×1
BASKET STONE 1.7 NGAGE (UROLOGICAL SUPPLIES) ×2 IMPLANT
BASKET ZERO TIP NITINOL 2.4FR (BASKET) IMPLANT
BSKT STON RTRVL ZERO TP 2.4FR (BASKET)
CATH URET 5FR 28IN CONE TIP (BALLOONS)
CATH URET 5FR 28IN OPEN ENDED (CATHETERS) ×2 IMPLANT
CATH URET 5FR 70CM CONE TIP (BALLOONS) IMPLANT
CLOTH BEACON ORANGE TIMEOUT ST (SAFETY) ×3 IMPLANT
ELECT REM PT RETURN 9FT ADLT (ELECTROSURGICAL)
ELECTRODE REM PT RTRN 9FT ADLT (ELECTROSURGICAL) IMPLANT
FIBER LASER FLEXIVA 365 (UROLOGICAL SUPPLIES) IMPLANT
GLOVE SURG POLYISO LF SZ8 (GLOVE) ×3 IMPLANT
GOWN STRL REUS W/TWL XL LVL3 (GOWN DISPOSABLE) ×3 IMPLANT
GUIDEWIRE ANG ZIPWIRE 038X150 (WIRE) IMPLANT
GUIDEWIRE STR DUAL SENSOR (WIRE) ×3 IMPLANT
INFUSOR MANOMETER BAG 3000ML (MISCELLANEOUS) IMPLANT
IV NS IRRIG 3000ML ARTHROMATIC (IV SOLUTION) ×3 IMPLANT
KIT TURNOVER CYSTO (KITS) ×3 IMPLANT
MANIFOLD NEPTUNE II (INSTRUMENTS) ×3 IMPLANT
NS IRRIG 500ML POUR BTL (IV SOLUTION) ×3 IMPLANT
PACK CYSTO (CUSTOM PROCEDURE TRAY) ×3 IMPLANT
TRACTIP FLEXIVA PULS ID 200XHI (Laser) IMPLANT
TRACTIP FLEXIVA PULSE ID 200 (Laser)
TUBE CONNECTING 12'X1/4 (SUCTIONS) ×1
TUBE CONNECTING 12X1/4 (SUCTIONS) ×2 IMPLANT
TUBING UROLOGY SET (TUBING) IMPLANT

## 2021-08-28 NOTE — Anesthesia Preprocedure Evaluation (Addendum)
Anesthesia Evaluation  Patient identified by MRN, date of birth, ID band Patient awake    Reviewed: Allergy & Precautions, NPO status , Patient's Chart, lab work & pertinent test results  History of Anesthesia Complications (+) PONV  Airway Mallampati: III  TM Distance: >3 FB Neck ROM: Full    Dental no notable dental hx. (+) Teeth Intact, Dental Advisory Given   Pulmonary neg pulmonary ROS,    Pulmonary exam normal breath sounds clear to auscultation       Cardiovascular negative cardio ROS Normal cardiovascular exam Rhythm:Regular Rate:Normal     Neuro/Psych negative neurological ROS  negative psych ROS   GI/Hepatic negative GI ROS, Neg liver ROS,   Endo/Other  Morbid obesity (BMI 41)  Renal/GU Renal disease  negative genitourinary   Musculoskeletal negative musculoskeletal ROS (+)   Abdominal   Peds  (+) ADHD Hematology negative hematology ROS (+)   Anesthesia Other Findings   Reproductive/Obstetrics                            Anesthesia Physical Anesthesia Plan  ASA: 3  Anesthesia Plan: General   Post-op Pain Management:    Induction: Intravenous  PONV Risk Score and Plan: 4 or greater and Ondansetron, Dexamethasone, Midazolam, Aprepitant, Scopolamine patch - Pre-op and TIVA  Airway Management Planned: LMA  Additional Equipment:   Intra-op Plan:   Post-operative Plan: Extubation in OR  Informed Consent: I have reviewed the patients History and Physical, chart, labs and discussed the procedure including the risks, benefits and alternatives for the proposed anesthesia with the patient or authorized representative who has indicated his/her understanding and acceptance.     Dental advisory given  Plan Discussed with: CRNA  Anesthesia Plan Comments:        Anesthesia Quick Evaluation

## 2021-08-28 NOTE — Progress Notes (Signed)
Pharmacy Antibiotic Note  Cathy Johns is a 50 y.o. female at [facility] on 08/28/2021 for cystoscopy.  Pharmacy has been consulted for meropenem x1 preop dosing for bacteremia.  Plan: Meropenem 1g IV x1 dose preop  Height: 5\' 2"  (157.5 cm) Weight: 102.1 kg (225 lb) IBW/kg (Calculated) : 50.1   Estimated Creatinine Clearance: 84.6 mL/min (by C-G formula based on SCr of 0.9 mg/dL).    Allergies  Allergen Reactions   Brassica Oleracea Hives and Other (See Comments)    Broccoli   Thank you for allowing pharmacy to be a part of this patient's care.  PharmD, BCPS Clinical Pharmacist [facility] main pharmacy [phone] 08/28/2021 9:22 AM

## 2021-08-28 NOTE — Discharge Instructions (Addendum)
CYSTOSCOPY HOME CARE INSTRUCTIONS  Activity: Rest for the remainder of the day.  Do not drive or operate equipment today.  You may resume normal activities in one to two days as instructed by your physician.   Meals: Drink plenty of liquids and eat light foods such as gelatin or soup this evening.  You may return to a normal meal plan tomorrow.  Return to Work: You may return to work in one to two days or as instructed by your physician.  Special Instructions / Symptoms: Call your physician if any of these symptoms occur:   -persistent or heavy bleeding  -bleeding which continues after first few urination  -large blood clots that are difficult to pass  -urine stream diminishes or stops completely  -fever equal to or higher than 101 degrees Farenheit.  -cloudy urine with a strong, foul odor  -severe pain  Females should always wipe from front to back after elimination.  You may feel some burning pain when you urinate.  This should disappear with time.  Applying moist heat to the lower abdomen or a hot tub bath may help relieve the pain. \  Please bring your stone to the office for analysis.   Patient Signature:  ________________________________________________________  Nurse's Signature:  ________________________________________________________    Post Anesthesia Home Care Instructions  Activity: Get plenty of rest for the remainder of the day. A responsible individual must stay with you for 24 hours following the procedure.  For the next 24 hours, DO NOT: -Drive a car -Advertising copywriter -Drink alcoholic beverages -Take any medication unless instructed by your physician -Make any legal decisions or sign important papers.  Meals: Start with liquid foods such as gelatin or soup. Progress to regular foods as tolerated. Avoid greasy, spicy, heavy foods. If nausea and/or vomiting occur, drink only clear liquids until the nausea and/or vomiting subsides. Call your physician if  vomiting continues.  Special Instructions/Symptoms: Your throat may feel dry or sore from the anesthesia or the breathing tube placed in your throat during surgery. If this causes discomfort, gargle with warm salt water. The discomfort should disappear within 24 hours.  If you had a scopolamine patch placed behind your ear for the management of post- operative nausea and/or vomiting:  1. The medication in the patch is effective for 72 hours, after which it should be removed.  Wrap patch in a tissue and discard in the trash. Wash hands thoroughly with soap and water. 2. You may remove the patch earlier than 72 hours if you experience unpleasant side effects which may include dry mouth, dizziness or visual disturbances. 3. Avoid touching the patch. Wash your hands with soap and water after contact with the patch.     No Ibuprofen until 5:00 p.m.

## 2021-08-28 NOTE — Anesthesia Postprocedure Evaluation (Signed)
Anesthesia Post Note  Patient: Cathy Johns  Procedure(s) Performed: CYSTOSCOPY LEFT URETEROSCOPY WITH BASKET EXTRACTION OF STONE (Left: Ureter)     Patient location during evaluation: PACU Anesthesia Type: General Level of consciousness: awake and alert Pain management: pain level controlled Vital Signs Assessment: post-procedure vital signs reviewed and stable Respiratory status: spontaneous breathing, nonlabored ventilation, respiratory function stable and patient connected to nasal cannula oxygen Cardiovascular status: blood pressure returned to baseline and stable Postop Assessment: no apparent nausea or vomiting Anesthetic complications: no   No notable events documented.  Last Vitals:  Vitals:   08/28/21 1200 08/28/21 1215  BP: 122/71 126/71  Pulse: 77 78  Resp: 12 16  Temp:  36.9 C  SpO2: 95% 96%    Last Pain:  Vitals:   08/28/21 1215  TempSrc:   PainSc: 0-No pain                 Jhair Witherington L Kelce Bouton

## 2021-08-28 NOTE — Transfer of Care (Signed)
Immediate Anesthesia Transfer of Care Note  Patient: Cathy Johns  Procedure(s) Performed: Procedure(s) (LRB): CYSTOSCOPY LEFT URETEROSCOPY WITH BASKET EXTRACTION OF STONE (Left)  Patient Location: PACU  Anesthesia Type: General  Level of Consciousness: awake, oriented, sedated and patient cooperative  Airway & Oxygen Therapy: Patient Spontanous Breathing and Patient connected to face mask oxygen  Post-op Assessment: Report given to PACU RN and Post -op Vital signs reviewed and stable  Post vital signs: Reviewed and stable  Complications: No apparent anesthesia complications Last Vitals:  Vitals Value Taken Time  BP 117/64 08/28/21 1130  Temp 36.6 C 08/28/21 1110  Pulse 86 08/28/21 1132  Resp 15 08/28/21 1132  SpO2 94 % 08/28/21 1132  Vitals shown include unvalidated device data.  Last Pain:  Vitals:   08/28/21 1115  TempSrc:   PainSc: Asleep      Patients Stated Pain Goal: 4 (08/28/21 1115)  Complications: No notable events documented.

## 2021-08-28 NOTE — Interval H&P Note (Signed)
History and Physical Interval Note:  She is for left ureteroscopy for stone removal today. She has been on nitrofurantoin based on an office culture and will get meropenem today.   08/28/2021 10:16 AM  Cathy Johns  has presented today for surgery, with the diagnosis of LEFT URETERAL STONE.  The various methods of treatment have been discussed with the patient and family. After consideration of risks, benefits and other options for treatment, the patient has consented to  Procedure(s): CYSTOSCOPY LEFT URETEROSCOPY/HOLMIUM LASER/STENT PLACEMENT (Left) as a surgical intervention.  The patient's history has been reviewed, patient examined, no change in status, stable for surgery.  I have reviewed the patient's chart and labs.  Questions were answered to the patient's satisfaction.     Bjorn Pippin

## 2021-08-28 NOTE — Op Note (Signed)
Procedure: 1.  Cystoscopy with removal of left double-J stent. 2.  Left ureteroscopic stone extraction. 3.  Application of fluoroscopy.  Preop diagnosis: 4 mm left distal ureteral stone and small left lower pole stone.  Postop diagnosis: 4 mm and 2 mm left distal ureteral stones.  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  Surgeon: Dr. Bjorn Pippin.  Anesthesia: General.  Specimen: Stone.  Drain: None.  EBL: None.  Complications: None.  Indications: The patient is a 50 year old female initially presented with fever and an obstructing left distal ureteral stone.  She was also noted to have small left lower pole stone on CT.  She initially underwent stenting and returns now for ureteroscopic stone extraction.  Procedure: She had been on nitrofurantoin preoperatively per culture and was given meropenem in the operating room.  A general anesthetic was induced.  She was placed in lithotomy position and fitted with PAS hose.  Her perineum and genitalia were prepped with Betadine solution and she was draped in usual sterile fashion.  Cystoscopy was performed the 21 [place] scope and 30 degree lens.  Examination revealed a normal urethra.  The bladder wall was smooth and pale without tumors, stones or inflammation.  The left ureteral orifice had a stent loop in place with mild erythema.  The right ureteral orifice was unremarkable.  The left ureteral stent was grasped with a grasping forceps and pulled to the urethral meatus.  A sensor wire was advanced to the kidney through the stent which was removed.  A short dual-lumen semirigid ureteroscope was then advanced through the urethra up the left ureteral orifice.  The stone was visualized in the intramural ureter and was grasped with an engage basket and removed.  A second smaller stone was noted proximal to the 4 mm stone and it was also retrieved.  A single-lumen digital flexible ureteroscope was then advanced over the wire to the kidney and the calyceal  system was inspected.  No retrievable stones were identified but there were some Randall's plaques noted.  The ureteroscope was backed out and the ureter was inspected on retrieval.  No significant ureteral injury or edema was identified and it was not felt stent replacement was indicated.  Once ureteroscope was removed the bladder was drained with the cystoscope.  She was taken down from lithotomy position, her anesthetic was reversed and she was moved recovery in stable condition.  There were no complications.  She will be given her stone fragments to bring to the office.

## 2021-08-28 NOTE — Anesthesia Procedure Notes (Signed)
Procedure Name: LMA Insertion Date/Time: 08/28/2021 10:35 AM Performed by: Francie Massing, CRNA Pre-anesthesia Checklist: Patient identified, Emergency Drugs available, Suction available and Patient being monitored Patient Re-evaluated:Patient Re-evaluated prior to induction Oxygen Delivery Method: Circle system utilized Preoxygenation: Pre-oxygenation with 100% oxygen Induction Type: IV induction Ventilation: Mask ventilation without difficulty LMA: LMA inserted LMA Size: 4.0 Number of attempts: 1 Airway Equipment and Method: Bite block Placement Confirmation: positive ETCO2 Tube secured with: Tape Dental Injury: Teeth and Oropharynx as per pre-operative assessment

## 2021-08-29 ENCOUNTER — Encounter (HOSPITAL_BASED_OUTPATIENT_CLINIC_OR_DEPARTMENT_OTHER): Payer: Self-pay | Admitting: Urology

## 2022-03-18 ENCOUNTER — Other Ambulatory Visit (HOSPITAL_BASED_OUTPATIENT_CLINIC_OR_DEPARTMENT_OTHER): Payer: Self-pay | Admitting: Family Medicine

## 2022-03-18 DIAGNOSIS — Z1231 Encounter for screening mammogram for malignant neoplasm of breast: Secondary | ICD-10-CM

## 2022-03-26 ENCOUNTER — Ambulatory Visit (HOSPITAL_BASED_OUTPATIENT_CLINIC_OR_DEPARTMENT_OTHER)
Admission: RE | Admit: 2022-03-26 | Discharge: 2022-03-26 | Disposition: A | Discharge status: Home / Self Care | Payer: BC Managed Care – PPO | Source: Ambulatory Visit | Attending: Family Medicine | Admitting: Family Medicine

## 2022-03-26 ENCOUNTER — Encounter (HOSPITAL_BASED_OUTPATIENT_CLINIC_OR_DEPARTMENT_OTHER): Payer: Self-pay

## 2022-03-26 DIAGNOSIS — Z1231 Encounter for screening mammogram for malignant neoplasm of breast: Secondary | ICD-10-CM | POA: Insufficient documentation

## 2022-03-26 IMAGING — MG MM DIGITAL SCREENING BILAT W/ TOMO AND CAD
6 of 12 series · 6 of 36 positions shown · non-contrast
Comparison: Previous exam(s).

CLINICAL DATA: Screening.

EXAM:
DIGITAL SCREENING BILATERAL MAMMOGRAM WITH TOMOSYNTHESIS AND CAD
TECHNIQUE: Bilateral screening digital craniocaudal and mediolateral oblique
mammograms were obtained. Bilateral screening digital breast
tomosynthesis was performed. The images were evaluated with
computer-aided detection.

[L XCCL synth-2D]
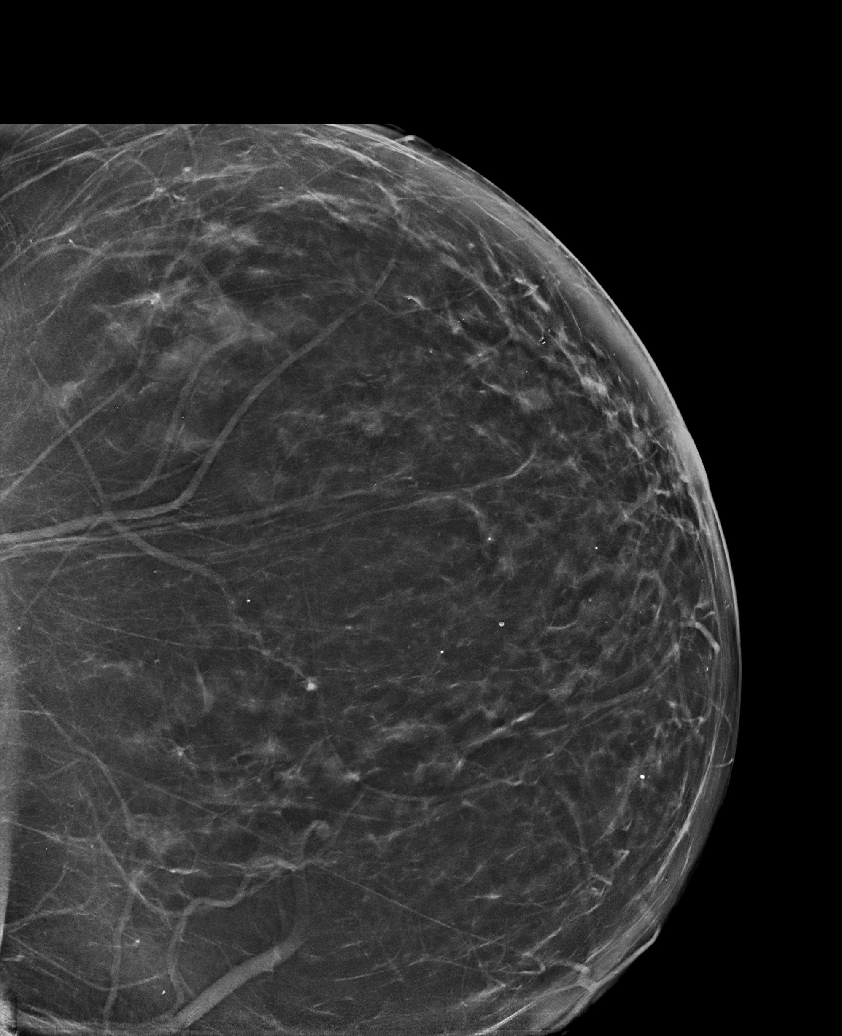

[R XCCL synth-2D]
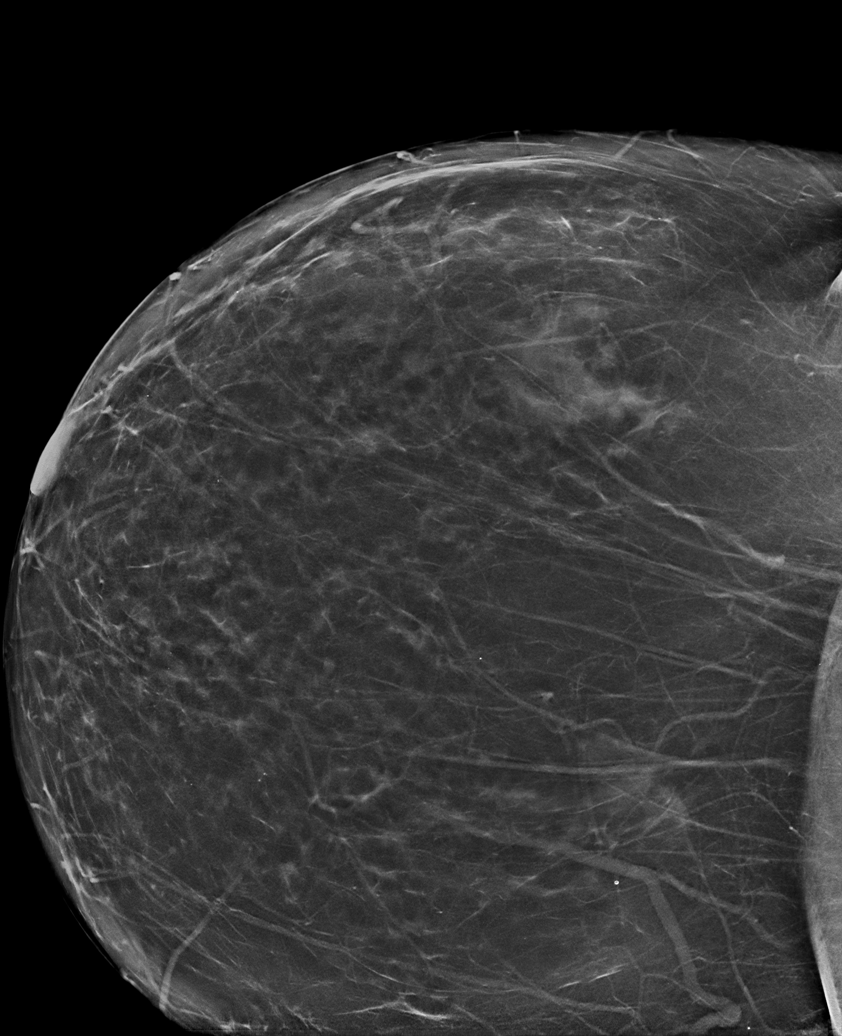

[R CC synth-2D]
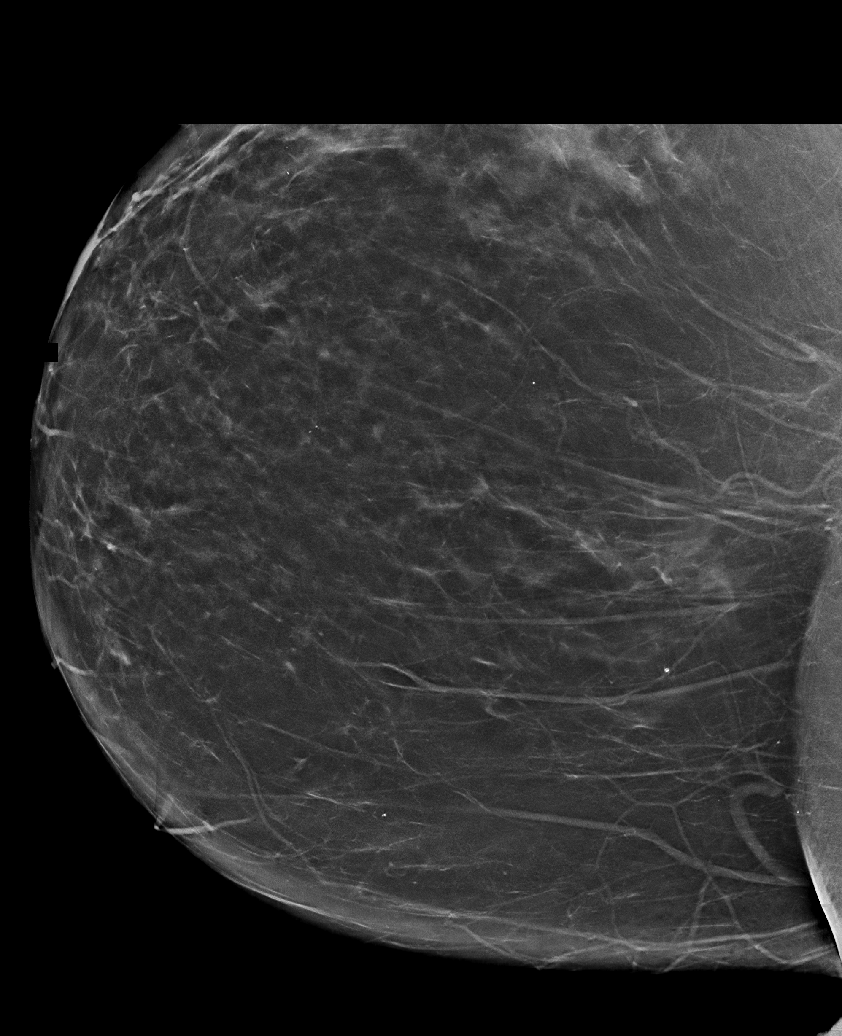

[L MLO synth-2D]
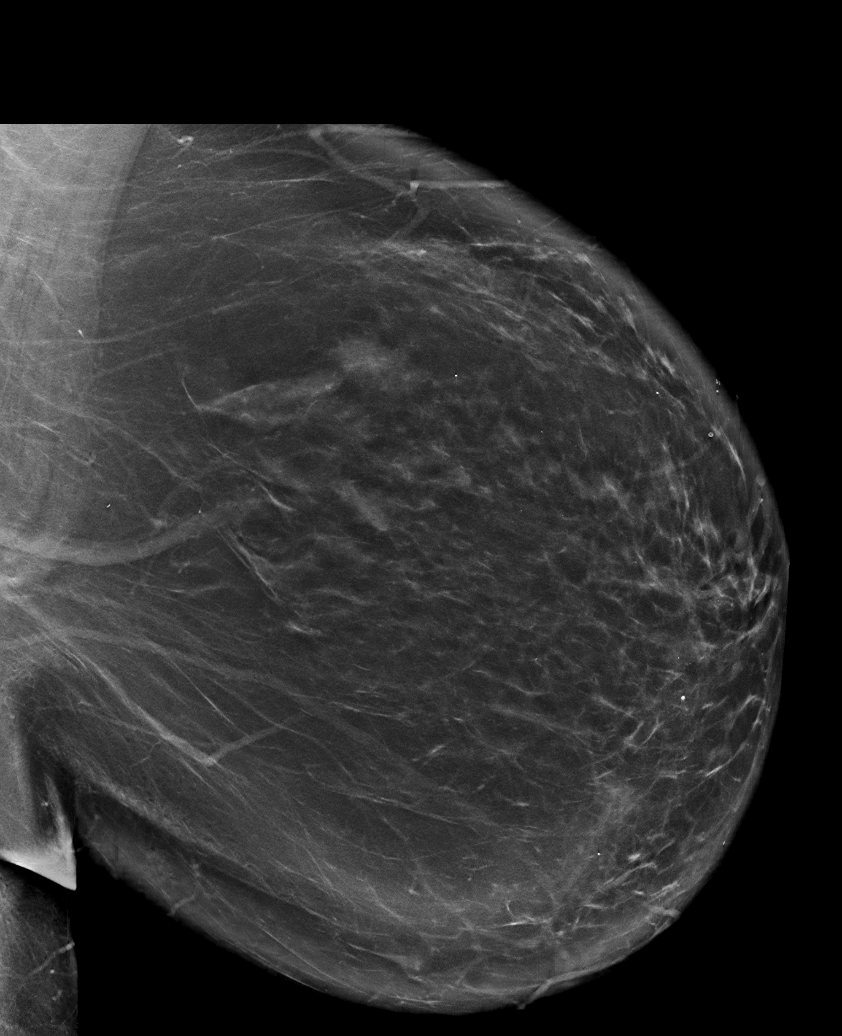

[R MLO synth-2D]
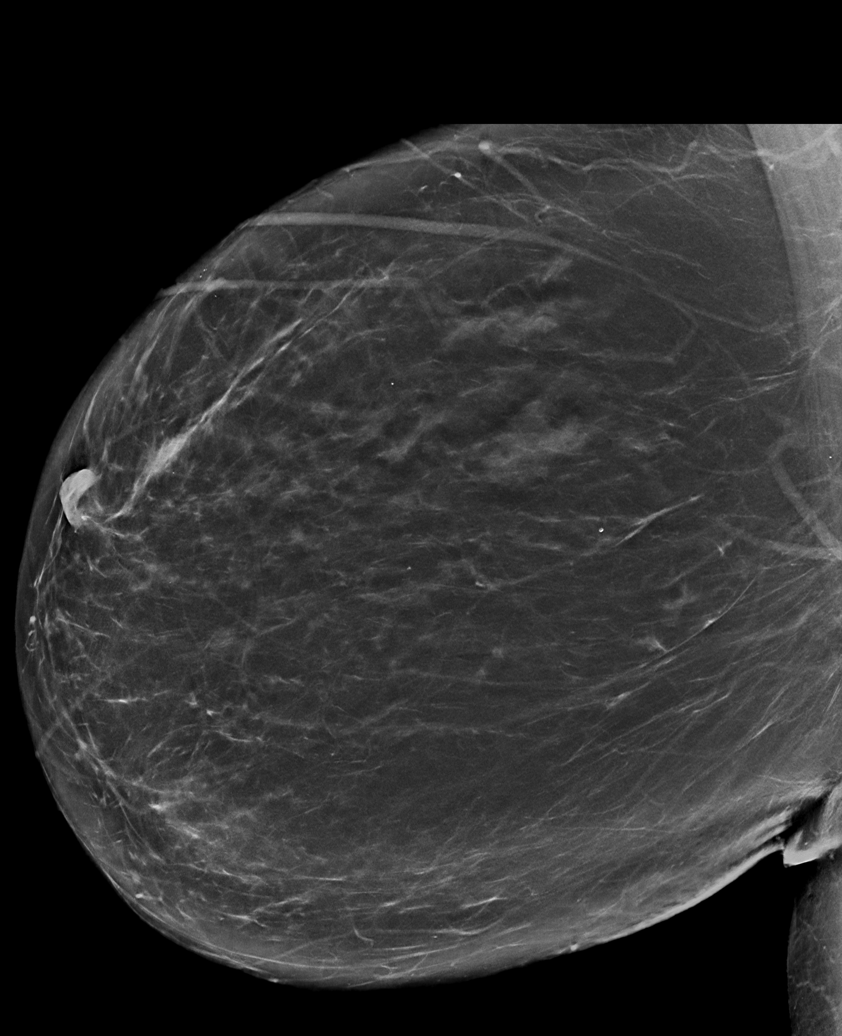

[L CC synth-2D]
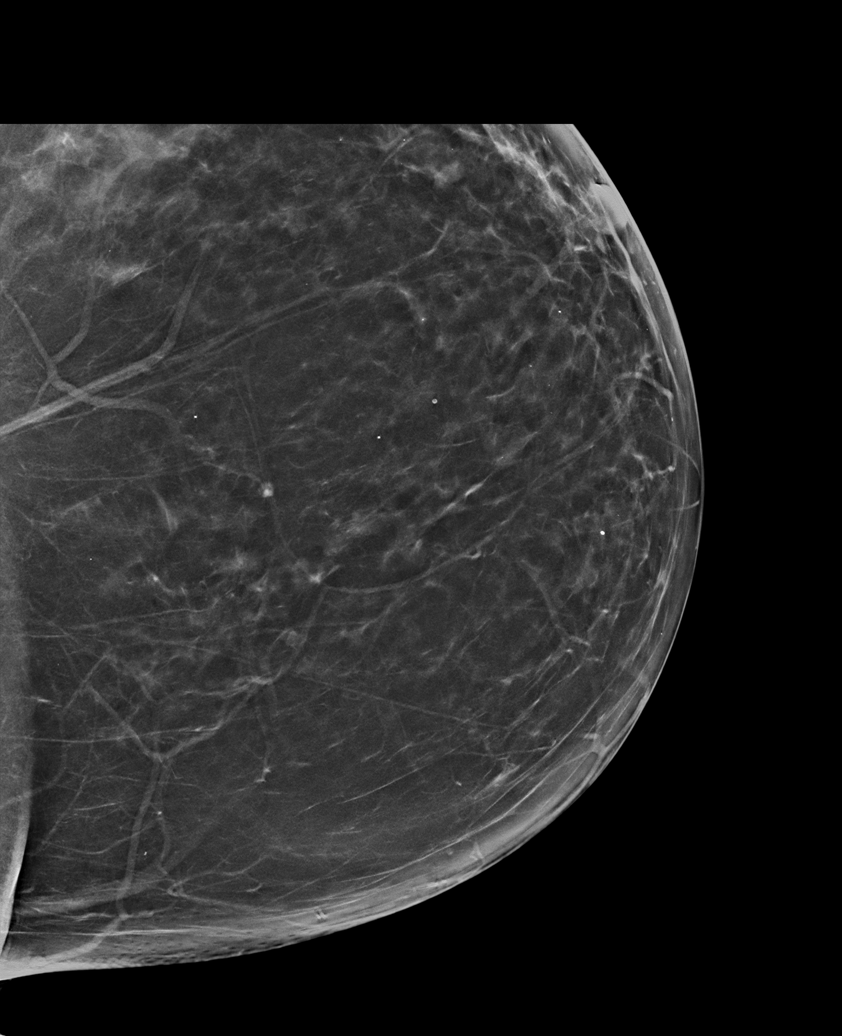

[6 of 36 positions shown; findings below may reference images not displayed]

ACR Breast Density Category b: There are scattered areas of
fibroglandular density.
FINDINGS: There are no findings suspicious for malignancy.
IMPRESSION: No mammographic evidence of malignancy. A result letter of this
screening mammogram will be mailed directly to the patient.

RECOMMENDATION:
Screening mammogram in one year. (Code:51-O-LD2)

BI-RADS CATEGORY  1: Negative.

## 2023-10-13 ENCOUNTER — Other Ambulatory Visit (HOSPITAL_BASED_OUTPATIENT_CLINIC_OR_DEPARTMENT_OTHER): Payer: Self-pay | Admitting: Obstetrics & Gynecology

## 2023-10-13 DIAGNOSIS — Z1231 Encounter for screening mammogram for malignant neoplasm of breast: Secondary | ICD-10-CM

## 2023-10-14 ENCOUNTER — Encounter (HOSPITAL_BASED_OUTPATIENT_CLINIC_OR_DEPARTMENT_OTHER): Payer: Self-pay

## 2023-10-14 ENCOUNTER — Ambulatory Visit (HOSPITAL_BASED_OUTPATIENT_CLINIC_OR_DEPARTMENT_OTHER)
Admission: RE | Admit: 2023-10-14 | Discharge: 2023-10-14 | Disposition: A | Discharge status: Home / Self Care | Payer: BC Managed Care – PPO | Source: Ambulatory Visit | Attending: Obstetrics & Gynecology | Admitting: Obstetrics & Gynecology

## 2023-10-14 DIAGNOSIS — Z1231 Encounter for screening mammogram for malignant neoplasm of breast: Secondary | ICD-10-CM | POA: Insufficient documentation
